# Patient Record
Sex: Female | Born: 1935 | Race: Black or African American | Hispanic: No | Marital: Married
Health system: Southern US, Community
[De-identification: ages and names within clinical notes are randomized; demographics above are authoritative.]

---

## 2003-10-01 ENCOUNTER — Other Ambulatory Visit: Payer: Self-pay

## 2003-10-02 ENCOUNTER — Other Ambulatory Visit: Payer: Self-pay

## 2004-03-29 ENCOUNTER — Inpatient Hospital Stay: Payer: Self-pay | Admitting: Internal Medicine

## 2004-03-29 ENCOUNTER — Other Ambulatory Visit: Payer: Self-pay

## 2004-08-21 ENCOUNTER — Ambulatory Visit: Payer: Self-pay | Admitting: Cardiology

## 2005-05-08 ENCOUNTER — Ambulatory Visit: Payer: Self-pay | Admitting: Ophthalmology

## 2005-05-28 ENCOUNTER — Ambulatory Visit: Payer: Self-pay | Admitting: Ophthalmology

## 2005-07-24 ENCOUNTER — Ambulatory Visit: Payer: Self-pay | Admitting: Internal Medicine

## 2005-08-05 ENCOUNTER — Ambulatory Visit: Payer: Self-pay | Admitting: Internal Medicine

## 2005-10-06 ENCOUNTER — Ambulatory Visit: Payer: Self-pay | Admitting: Internal Medicine

## 2006-01-31 ENCOUNTER — Other Ambulatory Visit: Payer: Self-pay

## 2006-01-31 ENCOUNTER — Inpatient Hospital Stay: Payer: Self-pay | Admitting: Internal Medicine

## 2006-02-01 ENCOUNTER — Other Ambulatory Visit: Payer: Self-pay

## 2006-02-02 ENCOUNTER — Other Ambulatory Visit: Payer: Self-pay

## 2006-08-19 ENCOUNTER — Ambulatory Visit: Payer: Self-pay | Admitting: Internal Medicine

## 2007-01-03 ENCOUNTER — Other Ambulatory Visit: Payer: Self-pay

## 2007-01-03 ENCOUNTER — Inpatient Hospital Stay: Payer: Self-pay | Admitting: Internal Medicine

## 2007-03-21 ENCOUNTER — Other Ambulatory Visit: Payer: Self-pay

## 2007-03-21 ENCOUNTER — Inpatient Hospital Stay: Payer: Self-pay | Admitting: Internal Medicine

## 2007-05-02 ENCOUNTER — Inpatient Hospital Stay: Payer: Self-pay | Admitting: Internal Medicine

## 2007-05-02 ENCOUNTER — Other Ambulatory Visit: Payer: Self-pay

## 2007-07-27 ENCOUNTER — Ambulatory Visit: Payer: Self-pay | Admitting: Internal Medicine

## 2007-08-05 ENCOUNTER — Ambulatory Visit: Payer: Self-pay | Admitting: General Surgery

## 2007-08-09 ENCOUNTER — Ambulatory Visit: Payer: Self-pay | Admitting: General Surgery

## 2007-08-19 ENCOUNTER — Ambulatory Visit: Payer: Self-pay | Admitting: Internal Medicine

## 2007-10-14 ENCOUNTER — Ambulatory Visit: Payer: Self-pay | Admitting: Internal Medicine

## 2007-10-15 ENCOUNTER — Emergency Department: Payer: Self-pay | Admitting: Internal Medicine

## 2007-10-15 ENCOUNTER — Other Ambulatory Visit: Payer: Self-pay

## 2008-11-30 ENCOUNTER — Ambulatory Visit: Payer: Self-pay | Admitting: Internal Medicine

## 2009-11-04 ENCOUNTER — Emergency Department: Payer: Self-pay | Admitting: Emergency Medicine

## 2009-12-15 IMAGING — US US CAROTID DUPLEX BILAT
1 series · 17 of 24 positions shown · non-contrast
Comparison: none

REASON FOR EXAM: syncope
COMMENTS:

[Series 1: us carotid duplex bilat · 17 of 56 slices shown]
[im 1/56]
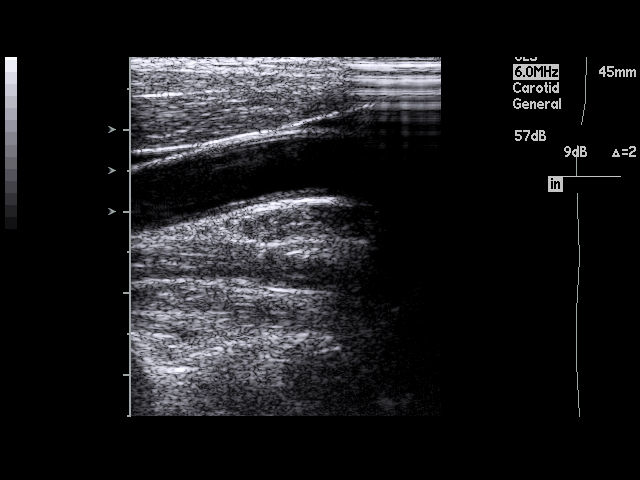
[im 5/56]
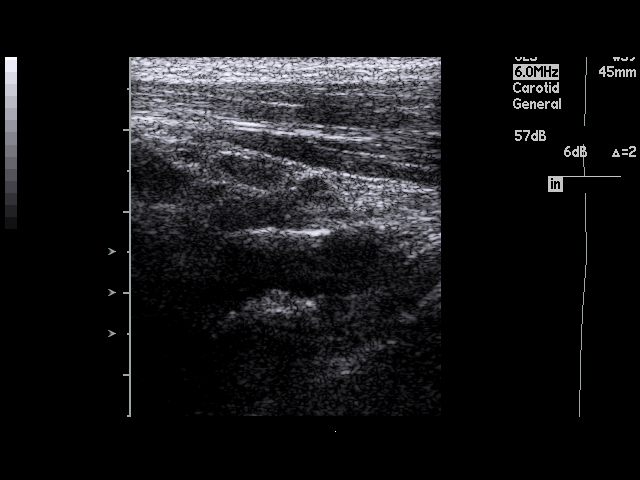
[im 8/56]
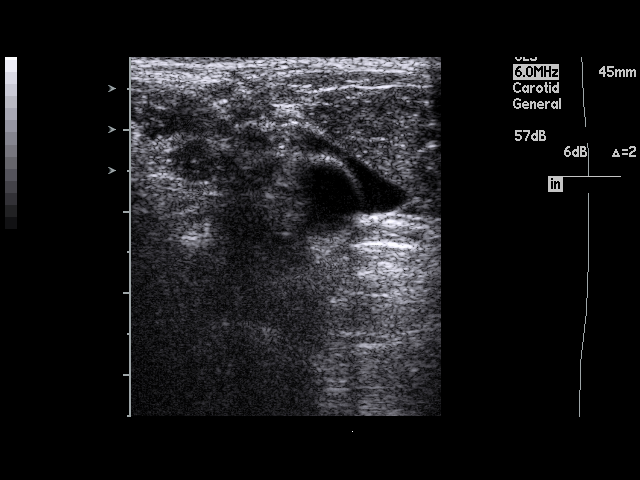
[im 10/56]
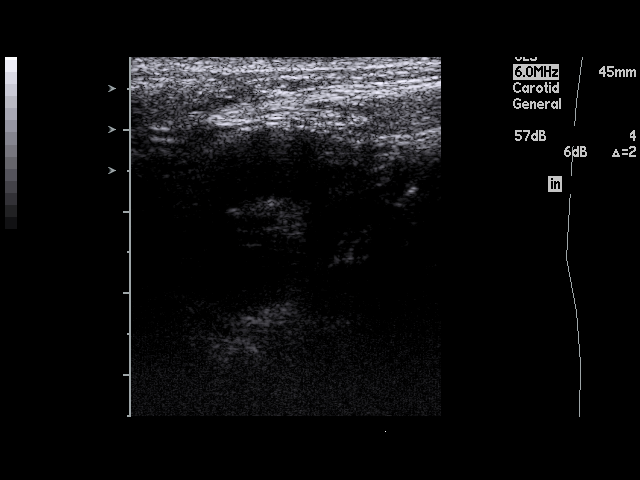
[im 15/56]
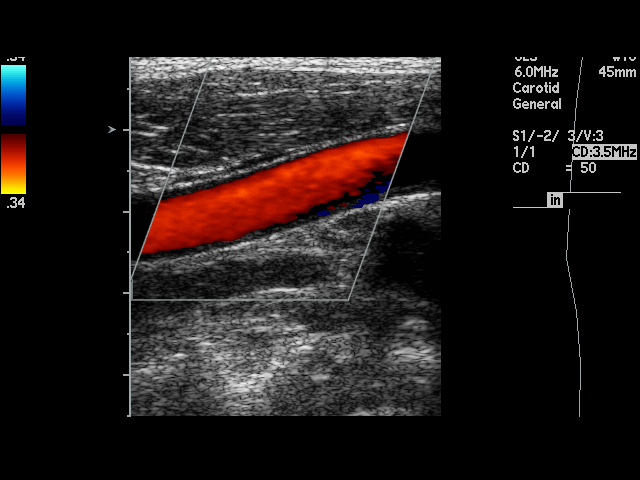
[im 17/56]
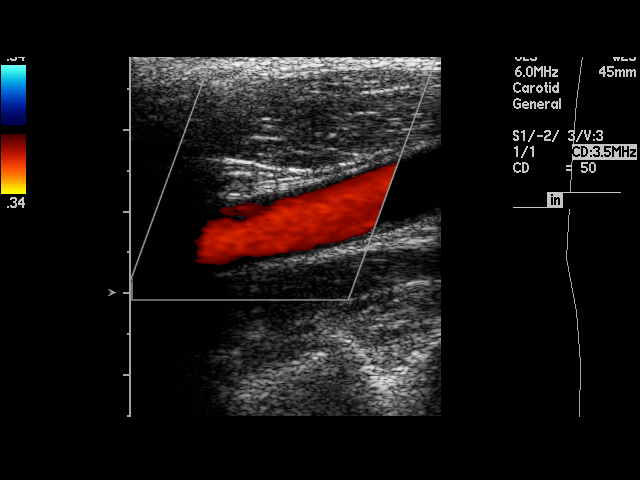
[im 22/56]
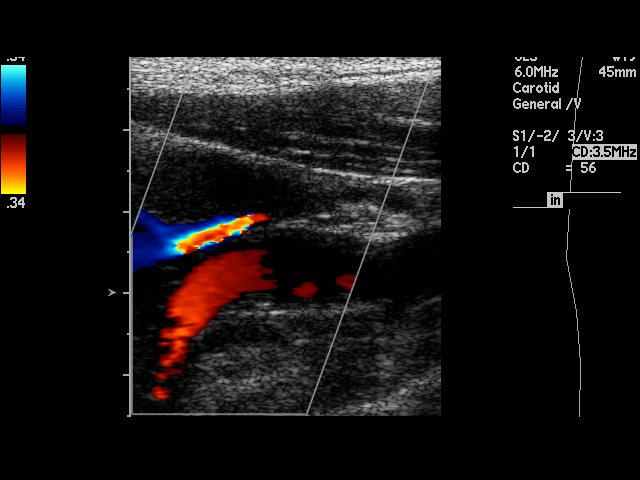
[im 24/56]
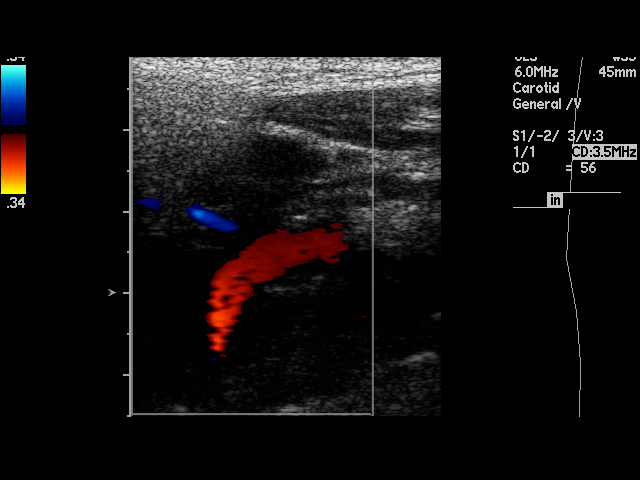
[im 29/56]
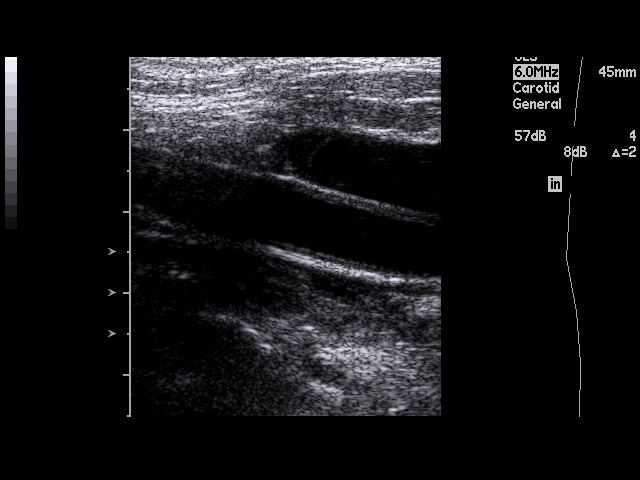
[im 32/56]
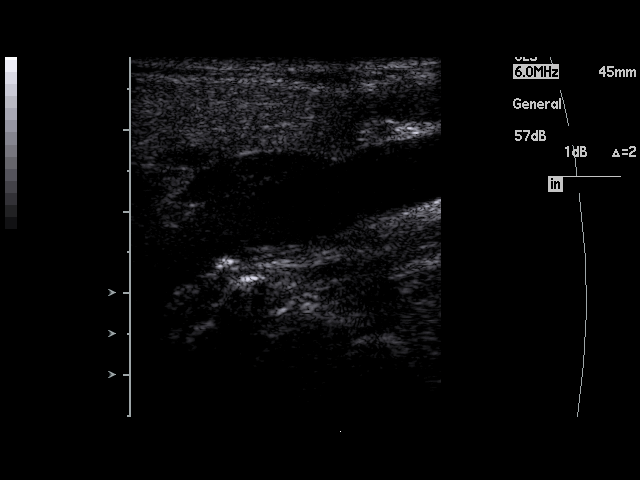
[im 34/56]
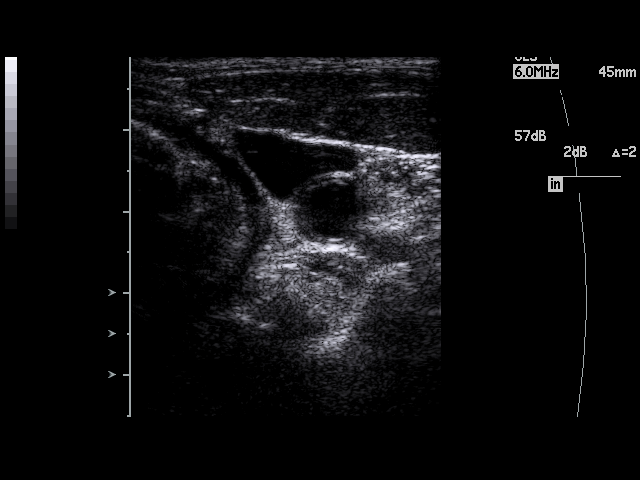
[im 39/56]
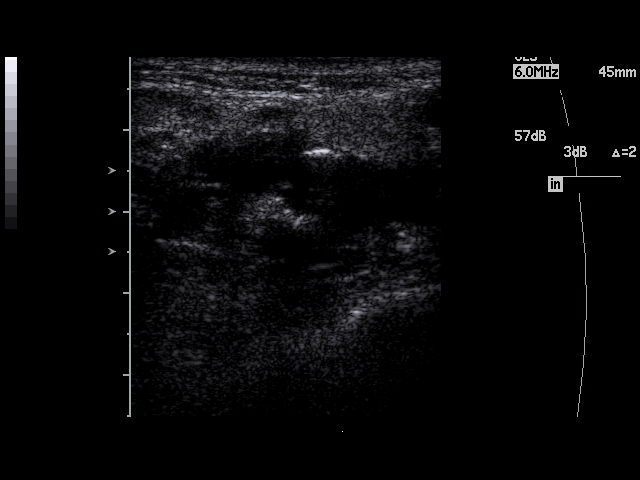
[im 41/56]
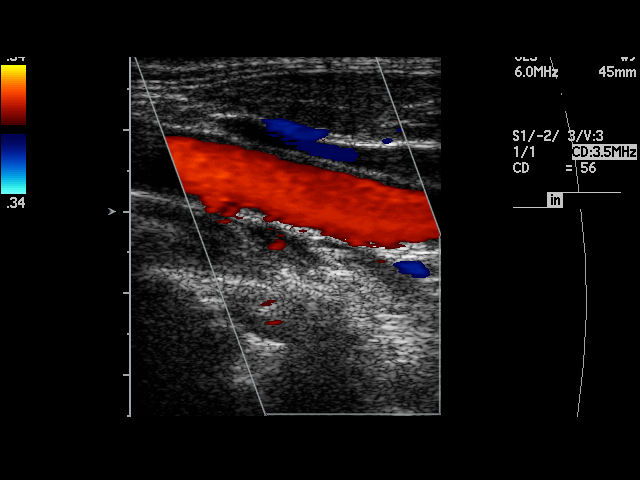
[im 46/56]
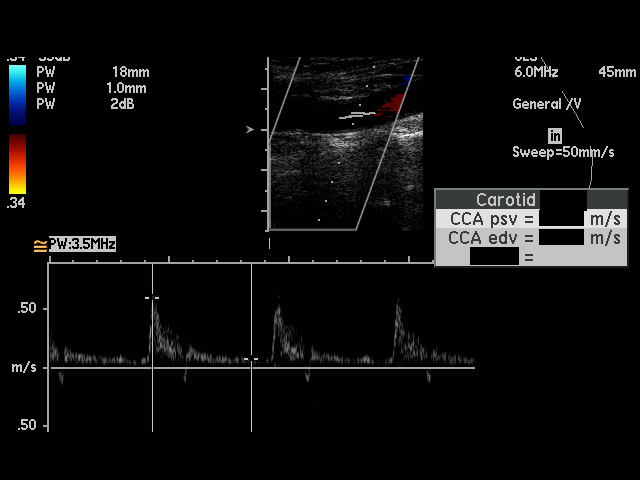
[im 48/56]
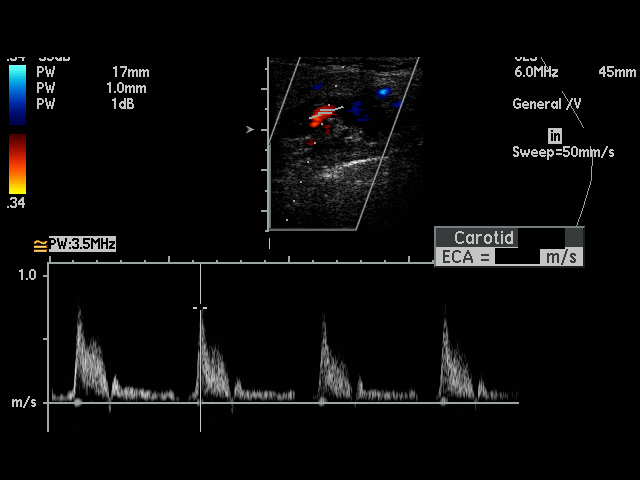
[im 51/56]
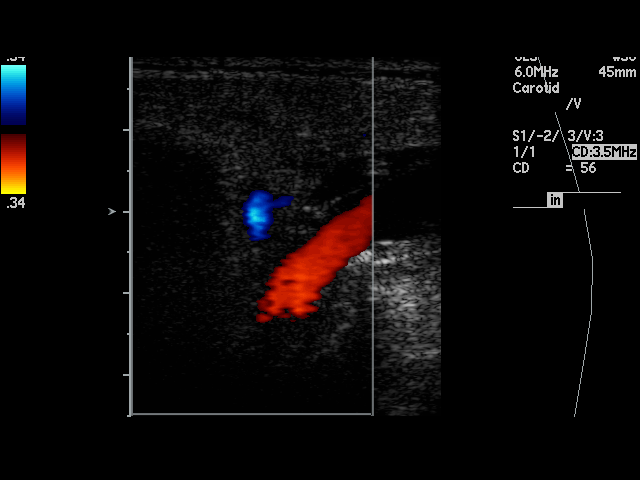
[im 56/56]
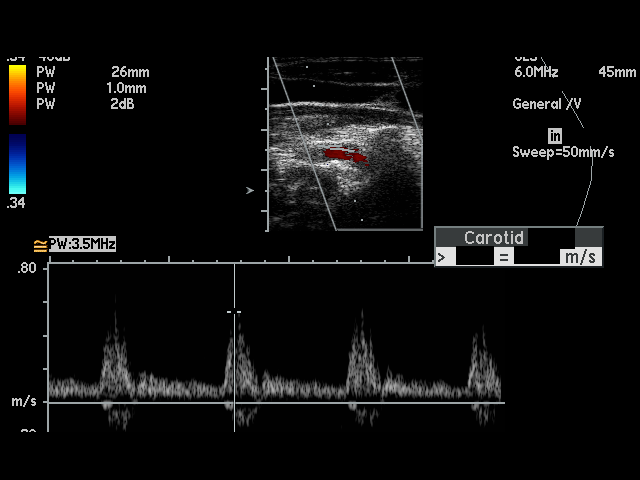

[17 of 24 positions shown; findings below may reference images not displayed]

PROCEDURE:     US  - US CAROTID DOPPLER BILATERAL  - March 22, 2007 [DATE]

RESULT:     There is slight calcific plaque formation about the carotid
bifurcations bilaterally.  On the RIGHT, the peak RIGHT common carotid
artery flow velocity measures 0.568 meters/second and the peak RIGHT
internal carotid artery flow velocity measures 0.535 meters/second. IC/CC
ratio is 0.942.

On the LEFT, the peak LEFT common carotid artery flow velocity measures
0.598 meters/second and the peak LEFT internal carotid artery flow velocity
measures 0.491 meters/second.  IC/CC ratio is 0.821.  These values
bilaterally are in the normal range and are consistent with the absence of
hemodynamically significant stenosis.

There is observed antegrade flow in both vertebrals.
IMPRESSION: 1.     No hemodynamically significant stenosis is identified on either side.
2.     Slight calcific plaque formation is noted at the carotid bifurcations
bilaterally.
3.     There is antegrade flow in both vertebrals.

## 2010-01-30 ENCOUNTER — Ambulatory Visit: Payer: Self-pay | Admitting: Unknown Physician Specialty

## 2010-01-31 ENCOUNTER — Inpatient Hospital Stay: Payer: Self-pay

## 2010-02-03 LAB — CANCER ANTIGEN 19-9: CA 19-9: 22 U/mL (ref 0–35)

## 2010-05-23 ENCOUNTER — Ambulatory Visit: Payer: Self-pay | Admitting: Internal Medicine

## 2010-10-19 ENCOUNTER — Emergency Department: Payer: Self-pay | Admitting: Emergency Medicine

## 2011-05-11 ENCOUNTER — Emergency Department: Payer: Self-pay | Admitting: Emergency Medicine

## 2011-05-11 LAB — CBC
HGB: 14.7 g/dL (ref 12.0–16.0)
MCH: 31 pg (ref 26.0–34.0)
MCHC: 33.5 g/dL (ref 32.0–36.0)
Platelet: 221 10*3/uL (ref 150–440)
RBC: 4.75 10*6/uL (ref 3.80–5.20)
RDW: 15.1 % — ABNORMAL HIGH (ref 11.5–14.5)
WBC: 5.9 10*3/uL (ref 3.6–11.0)

## 2011-05-11 LAB — COMPREHENSIVE METABOLIC PANEL
Albumin: 3.9 g/dL (ref 3.4–5.0)
Anion Gap: 8 (ref 7–16)
BUN: 20 mg/dL — ABNORMAL HIGH (ref 7–18)
Co2: 22 mmol/L (ref 21–32)
Creatinine: 1.42 mg/dL — ABNORMAL HIGH (ref 0.60–1.30)
EGFR (African American): 46 — ABNORMAL LOW
Potassium: 4.5 mmol/L (ref 3.5–5.1)
Sodium: 134 mmol/L — ABNORMAL LOW (ref 136–145)

## 2011-05-11 LAB — URINALYSIS, COMPLETE
Bilirubin,UR: NEGATIVE
Glucose,UR: NEGATIVE mg/dL (ref 0–75)
Ketone: NEGATIVE
Leukocyte Esterase: NEGATIVE
Protein: 75
Specific Gravity: 1.03 (ref 1.003–1.030)
Squamous Epithelial: NONE SEEN

## 2012-05-22 ENCOUNTER — Observation Stay: Payer: Self-pay | Admitting: Family Medicine

## 2012-05-22 LAB — COMPREHENSIVE METABOLIC PANEL
Albumin: 3.5 g/dL (ref 3.4–5.0)
Anion Gap: 7 (ref 7–16)
Bilirubin,Total: 0.2 mg/dL (ref 0.2–1.0)
Calcium, Total: 8 mg/dL — ABNORMAL LOW (ref 8.5–10.1)
Chloride: 101 mmol/L (ref 98–107)
Co2: 24 mmol/L (ref 21–32)
Creatinine: 1.62 mg/dL — ABNORMAL HIGH (ref 0.60–1.30)
EGFR (African American): 34 — ABNORMAL LOW
EGFR (Non-African Amer.): 29 — ABNORMAL LOW
Glucose: 115 mg/dL — ABNORMAL HIGH (ref 65–99)
Osmolality: 269 (ref 275–301)
SGPT (ALT): 150 U/L — ABNORMAL HIGH (ref 12–78)
Total Protein: 7 g/dL (ref 6.4–8.2)

## 2012-05-22 LAB — URINALYSIS, COMPLETE
Glucose,UR: NEGATIVE mg/dL (ref 0–75)
Nitrite: NEGATIVE
Ph: 5 (ref 4.5–8.0)
Specific Gravity: 1.017 (ref 1.003–1.030)
Squamous Epithelial: 2
WBC UR: 3 /HPF (ref 0–5)

## 2012-05-22 LAB — CBC
HCT: 42 % (ref 35.0–47.0)
MCV: 90 fL (ref 80–100)
RBC: 4.66 10*6/uL (ref 3.80–5.20)
RDW: 15.1 % — ABNORMAL HIGH (ref 11.5–14.5)

## 2012-05-22 LAB — CK TOTAL AND CKMB (NOT AT ARMC)
CK-MB: 12.8 ng/mL — ABNORMAL HIGH (ref 0.5–3.6)
CK-MB: 12.6 ng/mL — ABNORMAL HIGH (ref 0.5–3.6)

## 2012-05-22 LAB — TSH: Thyroid Stimulating Horm: 3.09 u[IU]/mL

## 2012-05-22 LAB — TROPONIN I: Troponin-I: 0.25 ng/mL — ABNORMAL HIGH

## 2012-05-23 LAB — CBC WITH DIFFERENTIAL/PLATELET
Basophil %: 1 %
Eosinophil #: 0 10*3/uL (ref 0.0–0.7)
Eosinophil %: 0.2 %
Lymphocyte #: 0.4 10*3/uL — ABNORMAL LOW (ref 1.0–3.6)
Lymphocyte %: 22.9 %
MCH: 29.8 pg (ref 26.0–34.0)
MCV: 90 fL (ref 80–100)
Monocyte #: 0.3 x10 3/mm (ref 0.2–0.9)
Monocyte %: 16.4 %
Neutrophil #: 1.1 10*3/uL — ABNORMAL LOW (ref 1.4–6.5)
Neutrophil %: 59.5 %
RBC: 4.66 10*6/uL (ref 3.80–5.20)
WBC: 1.8 10*3/uL — CL (ref 3.6–11.0)

## 2012-05-23 LAB — BASIC METABOLIC PANEL
BUN: 18 mg/dL (ref 7–18)
Calcium, Total: 7.9 mg/dL — ABNORMAL LOW (ref 8.5–10.1)
Co2: 24 mmol/L (ref 21–32)
Creatinine: 1.22 mg/dL (ref 0.60–1.30)
EGFR (Non-African Amer.): 41 — ABNORMAL LOW
Osmolality: 272 (ref 275–301)

## 2012-05-23 LAB — TROPONIN I: Troponin-I: 0.19 ng/mL — ABNORMAL HIGH

## 2012-05-23 LAB — HEPATIC FUNCTION PANEL A (ARMC)
Albumin: 3.1 g/dL — ABNORMAL LOW (ref 3.4–5.0)
Bilirubin, Direct: <0.1 mg/dL (ref 0.00–0.20)
Bilirubin,Total: 0.2 mg/dL (ref 0.2–1.0)
SGOT(AST): 141 U/L — ABNORMAL HIGH (ref 15–37)
Total Protein: 6.5 g/dL (ref 6.4–8.2)

## 2012-05-23 LAB — CK TOTAL AND CKMB (NOT AT ARMC)
CK, Total: 475 U/L — ABNORMAL HIGH (ref 21–215)
CK-MB: 17.4 ng/mL — ABNORMAL HIGH (ref 0.5–3.6)

## 2012-05-23 LAB — URINE CULTURE

## 2012-05-24 LAB — CBC WITH DIFFERENTIAL/PLATELET
Basophil #: 0 10*3/uL (ref 0.0–0.1)
Basophil %: 0.3 %
Eosinophil #: 0 10*3/uL (ref 0.0–0.7)
Eosinophil %: 0 %
HCT: 42.3 % (ref 35.0–47.0)
HGB: 14.1 g/dL (ref 12.0–16.0)
Lymphocyte #: 0.4 10*3/uL — ABNORMAL LOW (ref 1.0–3.6)
Lymphocyte %: 14.8 %
MCH: 30 pg (ref 26.0–34.0)
MCV: 90 fL (ref 80–100)
Monocyte #: 0.4 x10 3/mm (ref 0.2–0.9)
Neutrophil %: 70.9 %
Platelet: 152 10*3/uL (ref 150–440)
RBC: 4.69 10*6/uL (ref 3.80–5.20)
RDW: 15.3 % — ABNORMAL HIGH (ref 11.5–14.5)
WBC: 2.7 10*3/uL — ABNORMAL LOW (ref 3.6–11.0)

## 2012-05-24 LAB — COMPREHENSIVE METABOLIC PANEL
BUN: 13 mg/dL (ref 7–18)
Calcium, Total: 8 mg/dL — ABNORMAL LOW (ref 8.5–10.1)
Co2: 22 mmol/L (ref 21–32)
EGFR (African American): 56 — ABNORMAL LOW
Glucose: 99 mg/dL (ref 65–99)
Sodium: 135 mmol/L — ABNORMAL LOW (ref 136–145)
Total Protein: 6.3 g/dL — ABNORMAL LOW (ref 6.4–8.2)

## 2012-05-25 LAB — CBC WITH DIFFERENTIAL/PLATELET
Eosinophil %: 0 %
MCH: 29.7 pg (ref 26.0–34.0)
MCHC: 33.5 g/dL (ref 32.0–36.0)
MCV: 89 fL (ref 80–100)
Monocyte %: 13.9 %
Neutrophil #: 2.3 10*3/uL (ref 1.4–6.5)
Neutrophil %: 70.6 %
Platelet: 149 10*3/uL — ABNORMAL LOW (ref 150–440)
RBC: 4.62 10*6/uL (ref 3.80–5.20)

## 2012-05-25 LAB — COMPREHENSIVE METABOLIC PANEL
Albumin: 3.1 g/dL — ABNORMAL LOW (ref 3.4–5.0)
Anion Gap: 8 (ref 7–16)
BUN: 14 mg/dL (ref 7–18)
Chloride: 104 mmol/L (ref 98–107)
Co2: 23 mmol/L (ref 21–32)
Glucose: 99 mg/dL (ref 65–99)
Osmolality: 271 (ref 275–301)
SGOT(AST): 95 U/L — ABNORMAL HIGH (ref 15–37)
SGPT (ALT): 118 U/L — ABNORMAL HIGH (ref 12–78)
Total Protein: 6.4 g/dL (ref 6.4–8.2)

## 2012-06-10 ENCOUNTER — Ambulatory Visit: Payer: Self-pay | Admitting: Internal Medicine

## 2012-06-20 ENCOUNTER — Inpatient Hospital Stay: Payer: Self-pay | Admitting: Family Medicine

## 2012-06-20 LAB — CBC WITH DIFFERENTIAL/PLATELET
Basophil #: 0 10*3/uL (ref 0.0–0.1)
Basophil %: 0.1 %
Eosinophil #: 0 10*3/uL (ref 0.0–0.7)
Lymphocyte #: 0.3 10*3/uL — ABNORMAL LOW (ref 1.0–3.6)
MCH: 29.8 pg (ref 26.0–34.0)
MCHC: 34.1 g/dL (ref 32.0–36.0)
MCV: 88 fL (ref 80–100)
Monocyte #: 1.1 x10 3/mm — ABNORMAL HIGH (ref 0.2–0.9)
Monocyte %: 6.2 %
Neutrophil #: 15.9 10*3/uL — ABNORMAL HIGH (ref 1.4–6.5)
Neutrophil %: 92.2 %
RBC: 4.15 10*6/uL (ref 3.80–5.20)

## 2012-06-20 LAB — COMPREHENSIVE METABOLIC PANEL
Albumin: 2.7 g/dL — ABNORMAL LOW (ref 3.4–5.0)
Bilirubin,Total: 0.6 mg/dL (ref 0.2–1.0)
Calcium, Total: 8.9 mg/dL (ref 8.5–10.1)
Chloride: 101 mmol/L (ref 98–107)
Co2: 23 mmol/L (ref 21–32)
Creatinine: 1.22 mg/dL (ref 0.60–1.30)
EGFR (Non-African Amer.): 41 — ABNORMAL LOW
Glucose: 146 mg/dL — ABNORMAL HIGH (ref 65–99)
SGOT(AST): 35 U/L (ref 15–37)
SGPT (ALT): 27 U/L (ref 12–78)
Sodium: 132 mmol/L — ABNORMAL LOW (ref 136–145)
Total Protein: 7.2 g/dL (ref 6.4–8.2)

## 2012-06-20 LAB — TSH: Thyroid Stimulating Horm: 2.19 u[IU]/mL

## 2012-06-20 LAB — PRO B NATRIURETIC PEPTIDE: B-Type Natriuretic Peptide: 26057 pg/mL — ABNORMAL HIGH (ref 0–450)

## 2012-06-20 LAB — TROPONIN I: Troponin-I: 0.08 ng/mL — ABNORMAL HIGH

## 2012-06-21 LAB — CBC WITH DIFFERENTIAL/PLATELET
Basophil %: 0.4 %
Eosinophil #: 0 10*3/uL (ref 0.0–0.7)
Eosinophil %: 0 %
HCT: 36 % (ref 35.0–47.0)
HGB: 12.2 g/dL (ref 12.0–16.0)
MCH: 29.5 pg (ref 26.0–34.0)
MCV: 87 fL (ref 80–100)
Monocyte %: 5.9 %
Neutrophil #: 17 10*3/uL — ABNORMAL HIGH (ref 1.4–6.5)
Neutrophil %: 92.4 %
Platelet: 414 10*3/uL (ref 150–440)
RBC: 4.14 10*6/uL (ref 3.80–5.20)
RDW: 14.1 % (ref 11.5–14.5)
WBC: 18.4 10*3/uL — ABNORMAL HIGH (ref 3.6–11.0)

## 2012-06-21 LAB — BASIC METABOLIC PANEL
BUN: 32 mg/dL — ABNORMAL HIGH (ref 7–18)
Chloride: 101 mmol/L (ref 98–107)
Co2: 22 mmol/L (ref 21–32)
Creatinine: 1.15 mg/dL (ref 0.60–1.30)
EGFR (African American): 51 — ABNORMAL LOW
Glucose: 143 mg/dL — ABNORMAL HIGH (ref 65–99)
Osmolality: 276 (ref 275–301)
Potassium: 4 mmol/L (ref 3.5–5.1)
Sodium: 133 mmol/L — ABNORMAL LOW (ref 136–145)

## 2012-06-21 LAB — LIPID PANEL
Ldl Cholesterol, Calc: 74 mg/dL (ref 0–100)
Triglycerides: 73 mg/dL (ref 0–200)
VLDL Cholesterol, Calc: 15 mg/dL (ref 5–40)

## 2012-06-21 LAB — PRO B NATRIURETIC PEPTIDE: B-Type Natriuretic Peptide: 21717 pg/mL — ABNORMAL HIGH (ref 0–450)

## 2012-06-21 LAB — MAGNESIUM: Magnesium: 2 mg/dL

## 2012-06-21 LAB — TROPONIN I: Troponin-I: 0.07 ng/mL — ABNORMAL HIGH

## 2012-06-22 LAB — CBC WITH DIFFERENTIAL/PLATELET
Eosinophil #: 0 10*3/uL (ref 0.0–0.7)
Eosinophil %: 0 %
HCT: 34.9 % — ABNORMAL LOW (ref 35.0–47.0)
Lymphocyte #: 0.3 10*3/uL — ABNORMAL LOW (ref 1.0–3.6)
Lymphocyte %: 1.6 %
Monocyte #: 0.8 x10 3/mm (ref 0.2–0.9)
Neutrophil #: 15.2 10*3/uL — ABNORMAL HIGH (ref 1.4–6.5)
Neutrophil %: 93.5 %
RBC: 4.01 10*6/uL (ref 3.80–5.20)
RDW: 14.6 % — ABNORMAL HIGH (ref 11.5–14.5)

## 2012-06-22 LAB — COMPREHENSIVE METABOLIC PANEL
Albumin: 2.1 g/dL — ABNORMAL LOW (ref 3.4–5.0)
Alkaline Phosphatase: 97 U/L (ref 50–136)
Anion Gap: 9 (ref 7–16)
BUN: 30 mg/dL — ABNORMAL HIGH (ref 7–18)
Bilirubin,Total: 0.5 mg/dL (ref 0.2–1.0)
Calcium, Total: 8 mg/dL — ABNORMAL LOW (ref 8.5–10.1)
Chloride: 102 mmol/L (ref 98–107)
Co2: 27 mmol/L (ref 21–32)
EGFR (Non-African Amer.): 43 — ABNORMAL LOW
Glucose: 139 mg/dL — ABNORMAL HIGH (ref 65–99)
Potassium: 2.7 mmol/L — ABNORMAL LOW (ref 3.5–5.1)
SGOT(AST): 31 U/L (ref 15–37)
SGPT (ALT): 25 U/L (ref 12–78)

## 2012-06-23 LAB — CBC WITH DIFFERENTIAL/PLATELET
Basophil #: 0 10*3/uL (ref 0.0–0.1)
Basophil %: 0.1 %
Eosinophil #: 0 10*3/uL (ref 0.0–0.7)
Eosinophil %: 0.1 %
HGB: 12.9 g/dL (ref 12.0–16.0)
Lymphocyte #: 0.3 10*3/uL — ABNORMAL LOW (ref 1.0–3.6)
Lymphocyte %: 1.9 %
MCH: 29.1 pg (ref 26.0–34.0)
MCV: 88 fL (ref 80–100)
Monocyte #: 0.8 x10 3/mm (ref 0.2–0.9)
Neutrophil #: 15.2 10*3/uL — ABNORMAL HIGH (ref 1.4–6.5)
Neutrophil %: 93.1 %
RBC: 4.43 10*6/uL (ref 3.80–5.20)
RDW: 14.5 % (ref 11.5–14.5)

## 2012-06-23 LAB — BASIC METABOLIC PANEL
Calcium, Total: 8.6 mg/dL (ref 8.5–10.1)
Chloride: 103 mmol/L (ref 98–107)
Co2: 29 mmol/L (ref 21–32)
Creatinine: 1.17 mg/dL (ref 0.60–1.30)
EGFR (African American): 50 — ABNORMAL LOW
EGFR (Non-African Amer.): 43 — ABNORMAL LOW
Glucose: 129 mg/dL — ABNORMAL HIGH (ref 65–99)
Potassium: 3.4 mmol/L — ABNORMAL LOW (ref 3.5–5.1)
Sodium: 140 mmol/L (ref 136–145)

## 2012-06-24 LAB — CBC WITH DIFFERENTIAL/PLATELET
Basophil #: 0 10*3/uL (ref 0.0–0.1)
Basophil %: 0.2 %
Eosinophil %: 0.1 %
HCT: 40 % (ref 35.0–47.0)
Lymphocyte #: 0.3 10*3/uL — ABNORMAL LOW (ref 1.0–3.6)
Lymphocyte %: 1.4 %
MCH: 29.3 pg (ref 26.0–34.0)
MCV: 88 fL (ref 80–100)
Monocyte %: 4.1 %
Neutrophil #: 17.8 10*3/uL — ABNORMAL HIGH (ref 1.4–6.5)
Platelet: 506 10*3/uL — ABNORMAL HIGH (ref 150–440)
RBC: 4.54 10*6/uL (ref 3.80–5.20)
RDW: 14.5 % (ref 11.5–14.5)

## 2012-06-24 LAB — BASIC METABOLIC PANEL
BUN: 37 mg/dL — ABNORMAL HIGH (ref 7–18)
Calcium, Total: 8.7 mg/dL (ref 8.5–10.1)
Chloride: 103 mmol/L (ref 98–107)
Co2: 30 mmol/L (ref 21–32)
Creatinine: 1.22 mg/dL (ref 0.60–1.30)
Glucose: 171 mg/dL — ABNORMAL HIGH (ref 65–99)
Potassium: 3.5 mmol/L (ref 3.5–5.1)
Sodium: 140 mmol/L (ref 136–145)

## 2012-06-25 LAB — BASIC METABOLIC PANEL
BUN: 45 mg/dL — ABNORMAL HIGH (ref 7–18)
Calcium, Total: 8.4 mg/dL — ABNORMAL LOW (ref 8.5–10.1)
Co2: 31 mmol/L (ref 21–32)
EGFR (African American): 46 — ABNORMAL LOW
Glucose: 139 mg/dL — ABNORMAL HIGH (ref 65–99)
Potassium: 3.7 mmol/L (ref 3.5–5.1)
Sodium: 143 mmol/L (ref 136–145)

## 2012-06-25 LAB — CBC WITH DIFFERENTIAL/PLATELET
Basophil %: 0.5 %
Eosinophil %: 0.8 %
HCT: 39.4 % (ref 35.0–47.0)
Lymphocyte #: 0.5 10*3/uL — ABNORMAL LOW (ref 1.0–3.6)
Lymphocyte %: 2.7 %
MCH: 29 pg (ref 26.0–34.0)
MCHC: 32.9 g/dL (ref 32.0–36.0)
MCV: 88 fL (ref 80–100)
Monocyte #: 0.7 x10 3/mm (ref 0.2–0.9)
Monocyte %: 4.2 %
Neutrophil %: 91.8 %
RDW: 14.4 % (ref 11.5–14.5)

## 2012-06-26 LAB — CBC WITH DIFFERENTIAL/PLATELET
Basophil #: 0 10*3/uL (ref 0.0–0.1)
Eosinophil #: 0.3 10*3/uL (ref 0.0–0.7)
Eosinophil %: 2.2 %
Lymphocyte %: 3 %
MCHC: 32.8 g/dL (ref 32.0–36.0)
MCV: 89 fL (ref 80–100)
Neutrophil %: 90.3 %
RBC: 4.22 10*6/uL (ref 3.80–5.20)
RDW: 14.8 % — ABNORMAL HIGH (ref 11.5–14.5)

## 2012-06-26 LAB — BASIC METABOLIC PANEL
Anion Gap: 5 — ABNORMAL LOW (ref 7–16)
Calcium, Total: 8.8 mg/dL (ref 8.5–10.1)
Creatinine: 1.18 mg/dL (ref 0.60–1.30)
EGFR (African American): 49 — ABNORMAL LOW
EGFR (Non-African Amer.): 43 — ABNORMAL LOW
Potassium: 3.9 mmol/L (ref 3.5–5.1)

## 2012-06-26 LAB — CULTURE, BLOOD (SINGLE)

## 2012-06-28 LAB — BASIC METABOLIC PANEL
Calcium, Total: 8.5 mg/dL (ref 8.5–10.1)
Chloride: 105 mmol/L (ref 98–107)
Creatinine: 1.03 mg/dL (ref 0.60–1.30)
EGFR (African American): 58 — ABNORMAL LOW
EGFR (Non-African Amer.): 50 — ABNORMAL LOW
Glucose: 107 mg/dL — ABNORMAL HIGH (ref 65–99)
Osmolality: 286 (ref 275–301)
Sodium: 140 mmol/L (ref 136–145)

## 2012-06-28 LAB — CBC WITH DIFFERENTIAL/PLATELET
Basophil %: 0.4 %
Eosinophil #: 0.4 10*3/uL (ref 0.0–0.7)
Eosinophil %: 3.4 %
HCT: 37 % (ref 35.0–47.0)
HGB: 12.1 g/dL (ref 12.0–16.0)
Lymphocyte #: 0.5 10*3/uL — ABNORMAL LOW (ref 1.0–3.6)
MCV: 88 fL (ref 80–100)
Monocyte %: 6 %
Neutrophil %: 85.5 %
RBC: 4.23 10*6/uL (ref 3.80–5.20)

## 2012-06-29 LAB — BASIC METABOLIC PANEL
Anion Gap: 7 (ref 7–16)
BUN: 26 mg/dL — ABNORMAL HIGH (ref 7–18)
Calcium, Total: 8.7 mg/dL (ref 8.5–10.1)
Chloride: 105 mmol/L (ref 98–107)
Co2: 28 mmol/L (ref 21–32)
Glucose: 102 mg/dL — ABNORMAL HIGH (ref 65–99)
Potassium: 4.1 mmol/L (ref 3.5–5.1)

## 2012-06-29 LAB — CBC WITH DIFFERENTIAL/PLATELET
Basophil #: 0.1 10*3/uL (ref 0.0–0.1)
Basophil %: 0.6 %
Eosinophil %: 2.7 %
HCT: 39.7 % (ref 35.0–47.0)
HGB: 12.8 g/dL (ref 12.0–16.0)
Lymphocyte #: 0.5 10*3/uL — ABNORMAL LOW (ref 1.0–3.6)
Lymphocyte %: 3.9 %
MCH: 28.4 pg (ref 26.0–34.0)
MCHC: 32.3 g/dL (ref 32.0–36.0)
MCV: 88 fL (ref 80–100)
Monocyte #: 0.7 x10 3/mm (ref 0.2–0.9)
Monocyte %: 5.6 %
Neutrophil #: 10.2 10*3/uL — ABNORMAL HIGH (ref 1.4–6.5)
Neutrophil %: 87.2 %
Platelet: 517 10*3/uL — ABNORMAL HIGH (ref 150–440)
RDW: 14.4 % (ref 11.5–14.5)
WBC: 11.7 10*3/uL — ABNORMAL HIGH (ref 3.6–11.0)

## 2012-07-11 ENCOUNTER — Ambulatory Visit: Payer: Self-pay | Admitting: Internal Medicine

## 2012-07-31 ENCOUNTER — Emergency Department: Payer: Self-pay | Admitting: Emergency Medicine

## 2012-07-31 LAB — COMPREHENSIVE METABOLIC PANEL
Alkaline Phosphatase: 129 U/L (ref 50–136)
Anion Gap: 9 (ref 7–16)
Bilirubin,Total: 0.5 mg/dL (ref 0.2–1.0)
Calcium, Total: 8.9 mg/dL (ref 8.5–10.1)
Chloride: 101 mmol/L (ref 98–107)
Co2: 26 mmol/L (ref 21–32)
EGFR (African American): 56 — ABNORMAL LOW
EGFR (Non-African Amer.): 49 — ABNORMAL LOW
Glucose: 148 mg/dL — ABNORMAL HIGH (ref 65–99)
Osmolality: 277 (ref 275–301)
SGPT (ALT): 16 U/L (ref 12–78)
Total Protein: 7.2 g/dL (ref 6.4–8.2)

## 2012-07-31 LAB — CBC
HCT: 39.1 % (ref 35.0–47.0)
HGB: 12.9 g/dL (ref 12.0–16.0)
MCV: 84 fL (ref 80–100)
RBC: 4.64 10*6/uL (ref 3.80–5.20)
WBC: 9 10*3/uL (ref 3.6–11.0)

## 2012-07-31 LAB — TROPONIN I: Troponin-I: 0.02 ng/mL

## 2012-07-31 LAB — PRO B NATRIURETIC PEPTIDE: B-Type Natriuretic Peptide: 10596 pg/mL — ABNORMAL HIGH (ref 0–450)

## 2012-07-31 LAB — LIPASE, BLOOD: Lipase: 81 U/L (ref 73–393)

## 2012-09-10 ENCOUNTER — Ambulatory Visit: Payer: Self-pay | Admitting: Internal Medicine

## 2012-09-23 ENCOUNTER — Inpatient Hospital Stay: Payer: Self-pay

## 2012-09-23 LAB — CK TOTAL AND CKMB (NOT AT ARMC): CK-MB: 10.8 ng/mL — ABNORMAL HIGH (ref 0.5–3.6)

## 2012-09-23 LAB — CBC
HCT: 48.8 % — ABNORMAL HIGH (ref 35.0–47.0)
HGB: 16.2 g/dL — ABNORMAL HIGH (ref 12.0–16.0)
MCH: 26.7 pg (ref 26.0–34.0)
MCHC: 33.3 g/dL (ref 32.0–36.0)
MCV: 80 fL (ref 80–100)
Platelet: 193 10*3/uL (ref 150–440)
WBC: 7.5 10*3/uL (ref 3.6–11.0)

## 2012-09-23 LAB — COMPREHENSIVE METABOLIC PANEL
Albumin: 2.7 g/dL — ABNORMAL LOW (ref 3.4–5.0)
Anion Gap: 8 (ref 7–16)
BUN: 26 mg/dL — ABNORMAL HIGH (ref 7–18)
Bilirubin,Total: 0.5 mg/dL (ref 0.2–1.0)
Co2: 24 mmol/L (ref 21–32)
Creatinine: 1.2 mg/dL (ref 0.60–1.30)
EGFR (African American): 48 — ABNORMAL LOW
Osmolality: 269 (ref 275–301)
SGOT(AST): 20 U/L (ref 15–37)
SGPT (ALT): 21 U/L (ref 12–78)
Sodium: 132 mmol/L — ABNORMAL LOW (ref 136–145)
Total Protein: 6.6 g/dL (ref 6.4–8.2)

## 2012-09-23 LAB — PRO B NATRIURETIC PEPTIDE: B-Type Natriuretic Peptide: 22630 pg/mL — ABNORMAL HIGH (ref 0–450)

## 2012-09-24 LAB — BASIC METABOLIC PANEL
Anion Gap: 4 — ABNORMAL LOW (ref 7–16)
BUN: 24 mg/dL — ABNORMAL HIGH (ref 7–18)
Calcium, Total: 8.4 mg/dL — ABNORMAL LOW (ref 8.5–10.1)
Chloride: 100 mmol/L (ref 98–107)
Glucose: 103 mg/dL — ABNORMAL HIGH (ref 65–99)
Osmolality: 271 (ref 275–301)
Potassium: 3.7 mmol/L (ref 3.5–5.1)
Sodium: 133 mmol/L — ABNORMAL LOW (ref 136–145)

## 2012-09-24 LAB — CBC WITH DIFFERENTIAL/PLATELET
Basophil %: 0.6 %
Eosinophil #: 0 10*3/uL (ref 0.0–0.7)
Eosinophil %: 0.4 %
HGB: 12.7 g/dL (ref 12.0–16.0)
Lymphocyte #: 0.3 10*3/uL — ABNORMAL LOW (ref 1.0–3.6)
Lymphocyte %: 5.1 %
MCH: 26.7 pg (ref 26.0–34.0)
Monocyte #: 0.5 x10 3/mm (ref 0.2–0.9)
Monocyte %: 7 %
Neutrophil #: 5.8 10*3/uL (ref 1.4–6.5)
RBC: 4.77 10*6/uL (ref 3.80–5.20)
RDW: 18.2 % — ABNORMAL HIGH (ref 11.5–14.5)
WBC: 6.6 10*3/uL (ref 3.6–11.0)

## 2012-09-24 LAB — CK TOTAL AND CKMB (NOT AT ARMC)
CK, Total: 47 U/L (ref 21–215)
CK, Total: 68 U/L (ref 21–215)
CK-MB: 8.5 ng/mL — ABNORMAL HIGH (ref 0.5–3.6)

## 2012-09-24 LAB — TROPONIN I
Troponin-I: 0.03 ng/mL
Troponin-I: 0.02 ng/mL

## 2012-09-25 LAB — BASIC METABOLIC PANEL
Co2: 25 mmol/L (ref 21–32)
Creatinine: 1.16 mg/dL (ref 0.60–1.30)
Glucose: 99 mg/dL (ref 65–99)
Sodium: 132 mmol/L — ABNORMAL LOW (ref 136–145)

## 2012-09-26 LAB — BASIC METABOLIC PANEL
Anion Gap: 7 (ref 7–16)
BUN: 31 mg/dL — ABNORMAL HIGH (ref 7–18)
Creatinine: 1.3 mg/dL (ref 0.60–1.30)
EGFR (African American): 44 — ABNORMAL LOW
EGFR (Non-African Amer.): 38 — ABNORMAL LOW
Glucose: 75 mg/dL (ref 65–99)
Osmolality: 279 (ref 275–301)
Potassium: 4.5 mmol/L (ref 3.5–5.1)
Sodium: 137 mmol/L (ref 136–145)

## 2012-09-27 LAB — BASIC METABOLIC PANEL
Anion Gap: 5 — ABNORMAL LOW (ref 7–16)
Calcium, Total: 8.9 mg/dL (ref 8.5–10.1)
Chloride: 102 mmol/L (ref 98–107)
Co2: 30 mmol/L (ref 21–32)
EGFR (African American): >60
EGFR (Non-African Amer.): 52 — ABNORMAL LOW
Glucose: 103 mg/dL — ABNORMAL HIGH (ref 65–99)
Potassium: 4.2 mmol/L (ref 3.5–5.1)
Sodium: 137 mmol/L (ref 136–145)

## 2012-10-11 ENCOUNTER — Ambulatory Visit: Payer: Self-pay | Admitting: Internal Medicine

## 2012-12-11 DEATH — deceased

## 2014-06-02 NOTE — Consult Note (Signed)
CC: epigastric pain.  Her CRP is over 200, she has minimal- mild discomfort to deep exam, mild tendernes when palpate tightened abd muscles.  Resp rate elevated  and this could be a source for her abd pain.  Severe CHF with very high BNP.  She is a DNR.  She would not likely do very well with EGD.  Will cover with iv PPI if not already on it.  Try mylanta also.    Electronic Signatures: Scot JunElliott, Glenda Spelman T (MD)  (Signed on 12-May-14 19:20)  Authored  Last Updated: 12-May-14 19:20 by Scot JunElliott, Pati Thinnes T (MD)

## 2014-06-02 NOTE — Discharge Summary (Signed)
PATIENT NAME:  [name] [name], [name] [name] MR#:  [id number] DATE OF BIRTH:  March 18, 1926  DATE OF ADMISSION:  09/23/2012 DATE OF DISCHARGE:  09/27/2012  PRIMARY CARE PHYSICIAN: Marisue IvanKanhka Linthavong, MD.  CONSULTING: Palliative care, Dr. Harriett SineNancy Phifer.  DISCHARGE: To hospice home.   HISTORY OF PRESENT ILLNESS: This is an 81 year old with severe CHF and progressive decline over the last several months. She was admitted 08/14 with CHF exacerbation requiring oxygenation as well as heel decubitus and severe malnutrition. Please see admission history and physical for details.   HOSPITAL COURSE BY ISSUE: CHF exacerbation. The patient was gently diuresed; however, she continued with worsening oxygen status. She, by the day of discharge, was requiring Ventimask. Palliative care followed closely and discussed with the family transfer to hospice home. Family was in agreement. The patient will be discharged to hospice home for comfort care.   CODE STATUS: DNR.   DISCHARGE MEDICATIONS:  1.  Lasix 40 mg 1 tablet twice a day.  2.  Nitroglycerin sublingual as needed.  3.  Zoloft 100 mg once a day.  4.  Lorazepam 0.5 mg sublingually every 2 to 4 hours as needed for agitation and anxiety.  5.  Morphine 20 mg/mL concentrate 0.5 mL orally every 1 to 2 hours as needed.  6.  Zofran 4 mg oral dissolvable tablet 1 every 6 hours as needed.   DISCHARGE CODE STATUS: DNR.   DISCHARGE DIET: As tolerated.  TIME SPENT: This discharge took 25 minutes.  ____________________________ Stann Mainlandavid P. Sampson GoonFitzgerald, MD dpf:aw D: 09/27/2012 21:23:16 ET T: 09/28/2012 08:24:53 ET JOB#: [id number]  cc: Stann Mainlandavid P. Sampson GoonFitzgerald, MD, <Dictator> Marisue IvanKanhka Linthavong, MD DAVID [facility]AL[facility] MD ELECTRONICALLY SIGNED 10/01/2012 19:16

## 2014-06-02 NOTE — Consult Note (Signed)
PATIENT NAME:  [name] [name], [name] [name] MR#:  [id number] DATE OF BIRTH:  1926/04/12  DATE OF CONSULTATION:  06/23/2012  REFERRING PHYSICIAN:   CONSULTING PHYSICIAN:  Cristal Deerhristopher A. Nature Kueker, MD  REASON FOR CONSULTATION: Epigastric pain and shortness of breath.   HISTORY OF PRESENT ILLNESS: Ms. [name] [name] is a pleasant 81 year old female with history of coronary artery disease, CHF from ischemic cardiomyopathy with an EF of 30%, history of pacemaker placement, dyslipidemia and peripheral vascular disease, who comes back on 05/11 with epigastric pain and shortness of breath. She says that she no longer has any epigastric pain. She has had pain like this before and was able to take Mylanta and it did improve. She has never had an EGD. She has also been admitted for respiratory failure and has been treated for that. No fevers, chills, night sweats, shortness of breath, cough, chest pain, nausea, vomiting, diarrhea, constipation, dysuria, or hematuria.   PAST MEDICAL HISTORY: 1.  Ischemic cardiomyopathy and CHF with EF of 30%.  2.  Coronary artery disease, status post MI.  3.  Irritable bowel syndrome.  4.  Hypertension.  5.  Hyperlipidemia.  6.  Status post defib. placement for history of recurrent V. tach.  7.  Peripheral vascular disease.  8.  History of seasonal allergies.  9.  GERD.  10. Osteoarthritis.  11. Depression.  12. History of cholecystectomy and hysterectomy and cataract surgery.  ALLERGIES: LIPITOR, MEVACOR AND STATINS.   FAMILY HISTORY: Positive for CVA in her mother and colon cancer her father.   SOCIAL HISTORY: No tobacco use currently. Does not use alcohol or illegal drugs.   HOME MEDICATIONS:  1.  Tylenol.  2.  Tramadol.  3.  Systane ultra 4.  Sertraline.  5.  Protonix.  6.  Nitroglycerin p.[name].n.  7.  MiraLax.  8.  Gabapentin.  9.  Aspirin.  10. Amiodarone. 11. Allegra.  REVIEW OF SYSTEMS: A 12-point review of systems was obtained. Pertinent positives and negatives as  above.   PHYSICAL EXAMINATION:  VITAL SIGNS: Temperature 97.5, pulse 87, blood pressure 153/95, respirations 18, 92% on room air.  GENERAL: No acute distress. Alert and oriented x 3.  HEAD: Normocephalic, atraumatic. Eyes: No scleral icterus. No conjunctivitis.  LUNGS: Clear to auscultation, moving air well.  HEART: Regular rate and rhythm. No murmurs, rubs or gallops.  ABDOMEN: Soft, mildly tender epigastrium, nondistended.  EXTREMITIES: Moves all extremities well. Strength 5 out of 5.  NEUROLOGIC: Sensation intact in all 4 extremities. Cranial nerves II through XII grossly intact.   LABORATORY AND DIAGNOSTIC DATA: A white cell count of 16.3 with 93% neutrophils. Otherwise labs are relatively unremarkable. White cell count is slightly down from admission of 17.2. CT scan shows bilateral infiltrates. No obvious findings on noncontrast CT.   ASSESSMENT AND PLAN: Ms. [name] [name] is a pleasant 81 year old female with history of recurrent epigastric pain, which is improved with Mylanta. She is currently being seen by gastroenterology, who does not think that she is a candidate for EGD due to her medical comorbidities. Would continue treatment for peptic ulcer disease that she has responded to Mylanta. I did not see anything obvious in her abdomen. Her white cell count is elevated, the etiology of that is unknown. It does not appear to be her abdomen. She may have some component of chronic mesenteric ischemia, which could cause recurrent pain with her heart failure; however, is likely a poor surgical candidate and this may be limited by her cardiac disease. We will continue to follow  with you while in-house.  ____________________________ Si Raider. Om Lizotte, MD cal:aw D: 06/23/2012 07:33:27 ET T: 06/23/2012 07:44:02 ET JOB#: [id number]  cc: Cristal Deer A. Annalise Mcdiarmid, MD, <Dictator> Jarvis Newcomer MD ELECTRONICALLY SIGNED 06/23/2012 15:42

## 2014-06-02 NOTE — Consult Note (Signed)
PATIENT NAME:  [name] [name], [name] [name] MR#:  [id number] DATE OF BIRTH:  [birth year]  DATE OF CONSULTATION:  06/20/2012  REFERRING PHYSICIAN:  Huey Bienenstock, MD CONSULTING PHYSICIAN:  Lamar Blinks, MD  REASON FOR CONSULTATION: Acute on chronic systolic heart failure with coronary artery disease, hypertension, hyperlipidemia, and ischemic cardiomyopathy.   CHIEF COMPLAINT: "I got really short of breath and I have pain. "   HISTORY OF PRESENT ILLNESS:  This is an 81 year old female with known ischemic cardiomyopathy with ejection fraction of 30% by echocardiogram recently, in this past year, with known ventricular tachycardia status post previous defibrillator placement and amiodarone use with coronary artery disease having some shortness of breath, weakness, fatigue, and abdominal and lower chest discomfort. This is occurring more often and is at rest consistent with [place] class IV angina and/or New Heart Association class IV congestive heart failure and consistent with acute on chronic systolic dysfunction heart failure. The patient has received some Lasix to improve her heart failure symptoms, which is slightly improved, especially with oxygen.  Currently her troponin and CK-MB are within normal limits and the EKG has shown normal sinus rhythm with left axis deviation and right bundle branch block. There have been no other telemetry changes since admission.   REVIEW OF SYSTEMS: The remainder are negative for vision change, ringing in the ears, hearing loss, cough, congestion, heartburn, nausea, vomiting, diarrhea, bloody stools, stomach pain, extremity pain, leg weakness, cramping of the buttocks, known blood clots, headaches, blackouts, dizzy spells, nosebleeds, congestion, trouble swallowing, frequent urination, urination at night, muscle weakness, numbness, anxiety, depression, skin lesions, or skin rashes.   PAST MEDICAL HISTORY: 1.  Systolic dysfunction and cardiomyopathy.  2.  Hypertension.   3.  Hyperlipidemia.  4.  Coronary artery disease.  5.  Ventricular tachycardia. 6.  Valvular heart disease.   FAMILY HISTORY: No family members with early onset of cardiovascular disease or hypertension.   SOCIAL HISTORY: Currently denies alcohol or tobacco use.   ALLERGIES: As listed.   MEDICATIONS: As listed.   PHYSICAL EXAMINATION: VITAL SIGNS: Blood pressure 110/68 bilaterally and heart rate 72 upright, reclining, and regular.  GENERAL: She is a well appearing female in no acute distress.  HEENT: No icterus, thyromegaly, ulcers, hemorrhage, or xanthelasma.  HEART:  Regular rate and rhythm. Normal S1 and S2 with an apical murmur consistent with mitral regurgitation. PMI is anterolaterally displaced. Carotid upstroke normal without bruit. Jugular venous pressure is normal.  LUNGS: Bibasilar crackles with decreased breath sounds.  ABDOMEN: Soft and nontender without hepatosplenomegaly or masses. Abdominal aorta is normal size without bruit.  EXTREMITIES: 2+ radial, femoral, and dorsal pedal pulses with 1+ lower extremity edema. No cyanosis, clubbing, or ulcers.  NEUROLOGIC: She is oriented to time, place, and person with normal mood and affect.   ASSESSMENT: An 81 year old female with acute on chronic systolic dysfunction congestive heart failure and possible [place] class IV anginal equivalent without current evidence of myocardial infarction or ventricular tachycardia, on appropriate medication management, improving with Lasix.   RECOMMENDATIONS: 1.  Continue serial ECG and enzymes to assess for possible myocardial infarction.  2.  Ambulate and follow for improvements of symptoms and possible worsening anginal symptoms requiring further intervention including the possibility of cardiac catheterization or additional increase in nitrates.  3.  Further investigation of possible abdominal pain versus anginal equivalent.  4.  Continue telemetry to assess for possible ventricular  tachycardia.  5.  Begin ambulation with adjustments of medications which have been appropriate in the past  as an outpatient with nitrates, beta blocker, and amiodarone for ventricular tachycardia.  ____________________________ Lamar BlinksBruce J. Handsome Anglin, MD bjk:sb D: 06/20/2012 21:37:00 ET T: 06/21/2012 10:22:32 ET JOB#: [id number]  cc: Lamar BlinksBruce J. Io Dieujuste, MD, <Dictator> Lamar BlinksBRUCE J Amma Crear MD ELECTRONICALLY SIGNED 06/21/2012 13:47

## 2014-06-02 NOTE — Discharge Summary (Signed)
PATIENT NAME:  [name] [name], [name] [name] MR#:  [id number] DATE OF BIRTH:  [birth year]  STAT DISCHARGE SUMMARY  DATE OF ADMISSION:  06/20/2012 DATE OF DISCHARGE:  06/29/2012  DISCHARGE DIAGNOSES: 1.  Respiratory failure, secondary to pneumonia and pulmonary edema.  2.  Acute-on-chronic systolic congestive heart failure.  3.  History of coronary artery disease.  4.  Hypertension.  5.  Anxiety and depression.   DISCHARGE MEDICATIONS: 1.  MiraLAX 17 grams p.o. daily as needed for constipation.  2.  Allegra 180 mg p.o. daily.  3. Aspirin 81 mg p.o. daily.  4. Nitroglycerin 0.4 mg sublingual every five minutes x 3 as needed for chest tightness.  5.  Pantoprazole 40 mg p.o. daily.  6.  Sertraline 100 mg p.o. daily.  7.  Gabapentin 300 mg p.o. at bedtime.  8.  Tramadol 50 mg p.o. t.i.d. p.[name].n. for pain.  9.  Probiotic 1 capsule p.o. daily.  10.  Tylenol 500 mg, 2 capsules p.o. t.i.d. as needed for pain.  11.  Furosemide 40 mg p.o. b.i.d.  12.  Potassium chloride 20 mEq p.o. b.i.d.  13.  Ensure Plus 3 times a day with meals.   CONSULTS: None.   PROCEDURES: None.   PERTINENT LABORATORIES AND STUDIES: On the day of discharge: Sodium 140, potassium 4.1, creatinine 1.07, glucose 102, white blood cell count 11.7, hemoglobin 12.8, and platelets 517.   Chest x-ray did show edema and COPD prior to discharge. The patient was on 5 liters of nasal cannula.   BRIEF HOSPITAL COURSE:  1.  Acute respiratory failure: The patient initially came in with acute respiratory failure requiring high-flow O2, noted to have bilateral infiltrates on examination and chest x-ray. The patient was started on Zosyn. Did complete 10 days of Zosyn while she was here. She was given breathing treatments as well as diuretics. She was noted to have pulmonary edema on her chest x-ray with a history of congestive heart failure with a systolic EF of 25% to 30%. The patient has completed her antibiotic therapy and will continue on 5 liters  of nasal cannula to keep O2 sats above 92%.  2.  Systolic congestive heart failure: The patient was noted to have acute-on-chronic heart failure with pulmonary edema, EF of 25% to 30%. The patient's amiodarone was held during her hospital stay. We will continue all her other medications at this time.   Other chronic medical issues remained stable. She was evaluated by palliative care during her hospital stay. Did recommend potential hospice care as an outpatient. Family to discuss this further as an outpatient. [facility] and nursing care.   Will need PT therapy, and also need nasal cannula O2, 5 liters to keep her sats above 92%.    ____________________________ Marisue IvanKanhka Enrique Manganaro, MD kl:dm D: 06/29/2012 12:06:15 ET T: 06/29/2012 12:19:46 ET JOB#: [id number]  cc: Marisue IvanKanhka Kaisen Ackers, MD, <Dictator> Marisue IvanKANHKA Felma Pfefferle MD ELECTRONICALLY SIGNED 07/12/2012 11:17

## 2014-06-02 NOTE — Consult Note (Signed)
CC: Resp failure.  Pt breathing a little better today, abd flat and not tender.  She is doing better from her abd standpoint.  i would change to oral PPI and keep it twice a day.  I will sign off, reconsult if any significant abd change.  Electronic Signatures: Scot JunElliott, Robert T (MD)  (Signed on 16-May-14 18:39)  Authored  Last Updated: 16-May-14 18:39 by Scot JunElliott, Robert T (MD)

## 2014-06-02 NOTE — Consult Note (Signed)
CC: abd pain.  Her abd pain is gone at this time.  Abd flat, not tender, no palpable masses.  Continue Protonix and transition to oral and antacids.    Electronic Signatures: Scot JunElliott, Keatyn Jawad T (MD)  (Signed on 13-May-14 17:00)  Authored  Last Updated: 13-May-14 17:00 by Scot JunElliott, Rion Catala T (MD)

## 2014-06-02 NOTE — Consult Note (Signed)
Breifelly, saw patient for cardiology consultation. She came with episode of syncope, and this time is comfortable sitting in bed eating. In the past had similar episode and was told had dehydration. Found out she is followed by Dr. Gwen PoundsKowalski, thus will have him follow the patient.  Electronic Signatures: Radene KneeKhan, Shaukat Ali (MD)  (Signed on 13-Apr-14 17:49)  Authored  Last Updated: 13-Apr-14 17:49 by Radene KneeKhan, Shaukat Ali (MD)

## 2014-06-02 NOTE — H&P (Signed)
PATIENT NAME:  [name] [name], [name] [name] MR#:  [id number] DATE OF BIRTH:  May 01, 1926  DATE OF ADMISSION:  05/22/2012  PRIMARY CARE PROVIDER: Dr. Burnadette PopLinthavong.   EMERGENCY DEPARTMENT REFERRING PHYSICIAN: Dr. Mindi JunkerGottlieb.   CHIEF COMPLAINT: Syncope x 2.   HISTORY OF PRESENT ILLNESS: The patient is an 81 year old African American female with history of coronary artery disease, history of congestive heart failure due to ischemic cardiomyopathy status post defibrillator placement, history of hypertension, dyslipidemia, peripheral vascular disease, who has been feeling weak over the past few days; however, yesterday, the patient was sitting at the table eating breakfast when she just slumped over on the table. Then, she felt okay. EMS was called. At that time, her blood pressure was low. They told her to hold her blood pressure medication. The patient's daughter this morning held her Imdur but gave her her lisinopril, and then again today, the patient went to the bathroom, and then she was found again slumped down on the table in her bedroom. When she arrived in the ED, her blood pressure was noted to be in the 70s and was given IV fluid bolus here in the ED. The patient reports that she has been eating and drinking okay but has had a cough for the past few days of whitish productive sputum. She also has had some shortness of breath since yesterday. She complained of some epigastric discomfort yesterday. She has not had any nausea, vomiting or diarrhea. Denies any fevers or chills. Denies any chest pains. Denies any frequency, urgency or hesitancy.   ALLERGIES: TO ALL THE STATINS, LIPITOR, MEVACOR, ZOCOR AND PREVACID.   PAST MEDICAL HISTORY: 1.  Coronary artery disease with history of MI x 3.  2.  History of congestive heart failure with ischemic cardiomyopathy with EF of 30% to 35%.  3.  History of angina.  4.  History of irritable bowel syndrome.  5.  Hypertension.  6.  Dyslipidemia.  7.  Peripheral vascular  disease.  8.  History of recurrent V-tach status post cardioversion and defibrillator placement.  9.  Right intraocular vitreous bleeding secondary to anticoagulation with resultant right-sided vision loss.   PAST SURGICAL HISTORY: 1.  Status post cholecystectomy.  2.  Status post defibrillator placement.  3.  History of cardiac cath in 2003, which showed occluded LAD 50%, RCA 60%.  4.  Status post left cataract surgery.   CURRENT MEDICATIONS: She is on:  1.  Amiodarone 200 daily.  2.  Aspirin 81 mg one tab p.o. daily.  3.  Fexofenadine 80 daily.  4.  Isosorbide mononitrate 60 mg daily.  5.  Lisinopril 10 daily.  6.  Nitroglycerin p.[name].n.  7.  Protonix 40 daily.  8.  Tramadol 50 q. 8 p.[name].n.  9.  Gabapentin 300 at bedtime.  10. Sertraline 100 p.o. daily.   SOCIAL HISTORY: Quit smoking more than 60 years ago and has not smoked since. Denies any alcohol or drug use. Lives with her husband and daughter.   FAMILY HISTORY: Mother and sister had CVA. Father had colon cancer. There is a history of hypertension in the family.   REVIEW OF SYSTEMS:  CONSTITUTIONAL: Denies any fevers. Complains of fatigue and weakness. No pain. No weight loss. No weight gain.  EYES: No blurred or double vision. Does have a history of cataract and right eye blindness. No pain. No redness. No inflammation.  ENT: No tinnitus. No ear pain. No hearing loss. No seasonal or year-round allergies. No difficulty swallowing.  RESPIRATORY: Complains of nonproductive  cough. No wheezing. No hemoptysis. No COPD. No TB.  CARDIOVASCULAR: Denies any chest pain. Denies any orthopnea or edema. Has a history of ventricular tachycardia. No palpitations. No syncope.  GASTROINTESTINAL: No nausea, vomiting or diarrhea. No abdominal pain. No hematemesis. No melena.  GENITOURINARY: Denies any dysuria, hematuria, renal calculus or frequency.  ENDOCRINE: Denies any polyuria, nocturia or thyroid problems.  HEMATOLOGIC/LYMPHATIC: Denies any  major bruisability or bleeding.  SKIN: No acne. No rash. No changes in mole, hair or skin.  MUSCULOSKELETAL: Denies any pain in the neck, back or shoulder.  NEUROLOGIC: No CVA. No TIA. No seizures.  PSYCHIATRIC: No anxiety. No insomnia. No ADD.   PHYSICAL EXAMINATION: VITAL SIGNS: Temperature 97.7, pulse 81, respirations 18, blood pressure was 119/45 in the ED.  GENERAL: The patient is an elderly African American female currently not in any acute distress.  HEENT: Head atraumatic, normocephalic. Pupils equal, round and reactive to light and accommodation. There is no conjunctival pallor. No scleral icterus. Nasal exam shows no drainage or ulceration. Oropharynx is clear without any exudate.  NECK: No thyromegaly. No carotid bruits.  CARDIOVASCULAR: Regular rate and rhythm. S1, S2 positive. There is a systolic murmur. No rubs or gallops. Has an implantable cardiac defibrillator on the left side of the chest wall.  ABDOMEN: Soft, nontender and nondistended. Positive bowel sounds x 4. There is no guarding or rebound. No splenomegaly.  EXTREMITIES: No clubbing, cyanosis or edema.  SKIN: No rashes.  LYMPHATICS: No lymph nodes palpable.  NEUROLOGICAL: Cranial nerves II-XII grossly intact. No focal deficits.  PSYCHIATRIC: Awake, alert and oriented x 3. Not anxious or depressed.   EVALUATION: Glucose 115, BUN 23, creatinine 1.62, sodium 132, potassium 3.9, chloride of 101, CO2 is 24. LFTs: AST is 145 and ALT is 150. CPK is 408, CK-MB 12.8, troponin 0.31. TSH 3.09. WBC 2.8, hemoglobin 13.8, platelet count 176. UA: Bacteria 1+. Nitrites and leukocytes negative. Chest x-ray showed no acute abnormality. CT scan of the head showed no acute abnormality.   ASSESSMENT AND PLAN: The patient is an 81 year old with ischemic cardiomyopathy, hypertension, history of ventricular tachycardia, peripheral vascular disease, who presents with syncope x 2 with blood pressure in the 70s in the emergency department.  1.   Syncope due to hypotension. At this time, we will place her on telemetry, place her on observation, give her low-dose fluids in light of her congestive heart failure.  2.  Elevated cardiac enzyme. Follow enzymes. Cardiology evaluation. We will get an echocardiogram. We will place her on aspirin. The patient may have elevated cardiac enzymes due to possible demand ischemia. Has known coronary artery disease, which could also have worsened.  3.  Hypotension. We will hold lisinopril and Imdur. We will repeat orthostatics in the a.m.  4.  History of ventricular tachycardia. Continue amiodarone.  5.  Possible acute bronchitis. We will place her on p.o. Levaquin.  6.  Elevated liver function tests. We will check a hepatitis panel. Follow LFTs in the morning.   TIME SPENT: Thirty-five minutes.   ____________________________ Lacie Scotts Allena Katz, MD shp:lg D: 05/22/2012 16:08:18 ET T: 05/22/2012 16:48:41 ET JOB#: [id number]  cc: Jeffrie Stander H. Allena Katz, MD, <Dictator> Charise Carwin MD ELECTRONICALLY SIGNED 05/25/2012 20:33

## 2014-06-02 NOTE — Consult Note (Signed)
DATE OF BIRTH:  06/06/1926  DATE OF CONSULTATION:  05/23/2012  REFERRING PHYSICIAN:  Dr. Burnadette PopLinthavong  CONSULTING PHYSICIAN:  Dwayne D. Callwood, MD  INDICATION:  Syncope x 2.  HISTORY OF PRESENT ILLNESS:  The patient is an 81 year old African-American female with a history of coronary artery disease, congestive heart failure, ischemic cardiomyopathy, status post AICD, hypertension, dyslipidemia, peripheral vascular disease. States she has been feeling weak over the last few days. The day before admission, she was sitting at a table eating breakfast when she slumped over on the table. Then she felt okay. EMS was called at that time. Her blood pressure was low. They told her to hold her blood pressure medication. The patient's daughter held her Imdur but gave her lisinopril and then again, the patient went to bed  she was found again slumped down on a table in her bedroom. When she arrived in the ED, her blood pressure was noted to be in the 70s, and was given IV fluids. In the ED, the patient reported that she was eating and drinking, but had a cough for the last few days with whitish productive phlegm. She also had some shortness of breath since yesterday. She complained of   discomfort. Denied any nausea, vomiting or diarrhea. No chills or sweat frequency, but she had these 2 episodes of what sounded like hypotension and blackout, so she was brought to the Emergency Room.  REVIEW OF SYSTEMS:  Blackout spells, syncope. No nausea or vomiting. No fever, no chills, no sweats. No weight loss, no weight gain. No hemoptysis, hematemesis. No bright red blood per rectum. No vision change or hearing change. No sputum production or cough.   ALLERGIES:  STATINS, LIPITOR, MEVACOR, ZOCOR, PREVACID.   PAST MEDICAL HISTORY: Coronary artery disease, myocardial infarction x 3, congestive heart failure, ischemic cardiomyopathy, EF 30 to 35, angina, irritable bowel syndrome, hypertension, dyslipidemia, peripheral  vascular disease, recurrent VT,  status post cardioversion and defibrillator placement, right intraocular vitreous bleeding secondary to anticoagulation, resultant right-sided vision loss.  PAST SURGICAL HISTORY:  Includes status post cholecystectomy, status post defibrillator placement, history of cardiac cath in 2003, which showed occluded 50% in the LAD and 60% RCA, status post left cataract surgery.   MEDICATIONS:  Amiodarone 200 a day. Aspirin 81 mg a day. Fexofenadine 80 a day. Imdur 60 a day. Lisinopril 10 a day. Nitroglycerin p.r.n. Protonix 40. Tramadol 50 q. 8 p.r.n. Gabapentin 300 at bedtime. Sertraline 100 mg daily.   FAMILY HISTORY:  Mother and sister had CVA. Father had colon cancer, hypertension.   SOCIAL HISTORY:  Quit smoking 6 years ago, has not smoked since. Lives with her husband and daughter. No alcohol consumption. Retired.   PHYSICAL EXAMINATION: VITAL SIGNS:  Blood pressure was 120/50, pulse of 80, respiratory rate 16, afebrile.  HEENT:  Normocephalic, atraumatic. Pupils equal and reactive to light.  NECK:  Exam was supple. No significant JVD or adenopathy.  LUNGS: Exam was clear to auscultation and percussion. No significant wheeze. No rhonchi or rales.  HEART EXAM: Regular rate and rhythm. Positive S3. Soft S4. PMI displaced laterally.  ABDOMINAL EXAM:  Benign.  EXTREMITY EXAM: Within normal limits.  NEUROLOGIC: Exam intact.  SKIN:  Exam normal.   LABORATORIES:  Glucose 115, BUN 23, creatinine 1.62, sodium 132, potassium 2.9, chloride 101, CO2 of 24. AST 145, ALT 150. CK 408, MB 12.8. Troponin 0.31. TSH 3.09. White count 2.8, hemoglobin 13.8, platelet count 176. UA was unremarkable. CT was unremarkable.   ASSESSMENT: Syncope.  Hypertension. Borderline elevated troponin. Hypotension. Ventricular tachycardia. Cardiomyopathy. Acute bronchitis. LFTs. Chronic renal insufficiency.   PLAN: Agree with admit. Rule out for myocardial infarction. Follow up cardiac enzymes.  Follow up EKG. Follow up telemetry. Place on, again, telemetry to be sure she is not having ventricular arrhythmias. Hold her blood pressure medications for now. Fluid resuscitated because of possible dehydration and orthostatic hypotension. Recommend persistent hypotension treatment and care. Follow up troponins. Again, she has borderline troponins, she has renal insufficiency. This suggests possible demand ischemia. I doubt significant acute myocardial infarction. Possible bronchitis. Continue to follow up LFTs for hepatitis. Renal insufficiency. Again, will hopefully treat the patient conservatively and see how she does. Will consider interrogating the defibrillator, but there is no evidence that it discharged. There is no evidence of chest pain or angina. Hopefully, with resuscitation of fluids, she should do reasonably well on holding her blood pressure medication. Hopefully we can discharge the patient soon.      ____________________________ Bobbie Stack Juliann Pares, MD ddc:mr D: 05/23/2012 19:06:01 ET T: 05/23/2012 20:10:37 ET JOB#: [id number]  cc: Dwayne D. Juliann Pares, MD, <Dictator> Alwyn Pea MD ELECTRONICALLY SIGNED 06/24/2012 10:05

## 2014-06-02 NOTE — Consult Note (Signed)
CC abd pain.  She seems a little stronger today, speaking more clearly.  Abd not distended, not tender, no masses.  She drank 2 bottles of Ensure today.  No new suggestions, continue current GI meds.  Electronic Signatures: Scot JunElliott, Dehaven Sine T (MD)  (Signed on 15-May-14 18:37)  Authored  Last Updated: 15-May-14 18:37 by Scot JunElliott, Srishti Strnad T (MD)

## 2014-06-02 NOTE — Consult Note (Signed)
PATIENT NAME:  [name] [name], [name] [name] DATE OF BIRTH:  Sep 20, 1926  DATE OF CONSULTATION:  06/21/2012  REFERRING PHYSICIAN:   CONSULTING PHYSICIAN:  Scot Junobert T. Cohl Behrens, MD  HISTORY OF PRESENT ILLNESS:  The patient is an 81 year old black female who was admitted and found to have severe congestive heart failure with a BNP over 25,000. She has known heart disease. She has implantable defibrillator pacemaker and a history of decreased ejection fraction. She has systolic congestive heart failure, severe ischemic cardiomyopathy, ejection fraction 25% to 30%, recurrent V. tach, hypertension, peripheral vascular disease. On admission, she had chest x-ray consistent with congestive heart failure. She has developed some epigastric abdominal pain and I was asked to see her.   The patient has mild epigastric and mid upper abdominal discomfort. Her abdomen is soft. There is no peritoneal signs. There is no significant rebound. It is not tender to deep palpation. When she is able to tighten her muscles briefly, it is a little more painful for her.   She has not had any vomiting. She has some dysphagia at times. She and her daughter think her pain may be a little better sometimes with Mylanta and may also be a little better with moving her bowels.    PAST MEDICAL HISTORY: She has had a hysterectomy and her gallbladder out. She is a DNR, nurses may pronounce.   She has had discomfort in her calves, both calf muscles. Ultrasound was negative for clots.   PHYSICAL EXAM:  GENERAL:  Elderly black female with enhanced nasal oxygenation, able to carry on a conversation with short answers.  HEART: Tachycardia, rate about 120.  ABDOMEN: Bowel sounds are present. It is soft. No guarding to deep palpation. No palpable masses. Minimal tenderness which increases with trying to splint but she cannot splint very long because of her need to hyperventilate because of her heart disease.   ASSESSMENT: It is possible she  could have some muscle soreness of the epigastric area because of her need for rapid respirations.  It is possible that she could have a stress ulcer in her abdomen from her illness.  It is possible she could even have an occult neoplasm given her very elevated CRP.   RECOMMENDATIONS:  She is clearly not a candidate for an endoscopy because of her cardiorespiratory status. She has been placed on Protonix IV and I think Mylanta 4 times a day might be helpful. I will follow with you.   ____________________________ Scot Junobert T. Antoninette Lerner, MD rte:cs D: 06/21/2012 19:26:36 ET T: 06/21/2012 19:58:27 ET JOB#: [id number]  cc: Scot Junobert T. Gifford Ballon, MD, <Dictator> Danella PentonMark F. Miller, MD Marisue IvanKanhka Linthavong, MD Scot JunOBERT T Wood Novacek MD ELECTRONICALLY SIGNED 06/23/2012 14:15

## 2014-06-02 NOTE — H&P (Signed)
PATIENT NAME:  [name] [name], [name] [name] MR#:  [id number] DATE OF BIRTH:  Feb 10, 1927  DATE OF ADMISSION:  06/20/2012  REFERRING PHYSICIAN: Dorothea Glassman, MD  PRIMARY CARE PHYSICIAN: Marisue Ivan, MD    CHIEF COMPLAINT: Upper abdominal pain and shortness of breath.   HISTORY OF PRESENT ILLNESS:  This is an 81 year old female with history of coronary artery disease, systolic congestive heart failure with severe ischemic cardiomyopathy with an ejection fraction of 30%, with also history of defibrillator and permanent pacemaker due to recurrent V. tach, hypertension, dyslipidemia, peripheral vascular disease, recently admitted on 05/22/1012 for syncopal episode that occurred twice. At that moment, it was determined that the reason of the episodes were linked to medication management as the patient was having orthostatic hypotension. At that moment, she was asked to stop her blood pressure medications and diuretics.   The patient was discharged home in good condition, and today she comes back with a history of 2 days of shortness of breath and epigastric pain. Apparently, yesterday she started hurting below the left breast. The patient felt like the pain was superficial but then started becoming deeper down into the epigastric area. The patient states that she was having significant pain around the pacemaker as well. The intensity of the pain was 7 or 8 out of 10. Today it is 5 out of 10, feels really sharp, mildly relieved with Mylanta but worse with activity. Apparently, the patient does not use oxygen at home, but for the past 2 days she has started to get increase of shortness of breath. The patient at baseline is able to rest without any significant shortness of breath, started having shortness of breath whenever she gets out of the bed and starts walking a couple of steps. From the last 48 hours, the patient has been having shortness of breath with resting. The patient says that she has been coughing but  denies any fever or chills. She has not been eating much, and actually she had a couple of pounds weight loss. No significant edema or weight gain due to fluid retention. She states that her lower extremities, right more than the left, hurt especially at the level of the calf muscle. The patient is admitted with a possible diagnosis of CHF exacerbation but also, as per ER physician, he wanted to rule out pneumonia. She got some antibiotics including azithromycin and Rocephin for the possibility of pneumonia based on the fact that the patient had bilateral exudates on the x-ray and significant elevation of the white blood count. The patient was admitted for further evaluation.    REVIEW OF SYSTEMS:  CONSTITUTIONAL: Denies any fever. Positive fatigue. Positive weakness. Denies any significant weight gain. Positive for 2-pounds weight loss.  EYES: No blurry vision, double vision, glaucoma.  ENT: No tinnitus. No ear pain.  Positive hearing loss. No postnasal drip or difficulty swallowing.  RESPIRATORY: Positive cough. Positive shortness of breath, dyspnea. No wheezing. No painful respirations.   CARDIOVASCULAR: Positive pain below the breast on the left side, so left chest pain and epigastric pain. Positive orthopnea. The patient was not able to sleep last night unless she sat down on a recliner. On the baseline, she sleeps with 2 pillows, but that was not sufficient.  Negative palpitations. Negative syncope today, but she was admitted a month ago with 2 episodes of syncope.  They have not repeated again.  GENITOURINARY: No dysuria, hematuria or changes in frequency.  ENDOCRINOLOGY: No polyuria, polydipsia or aphasia, heat or cold intolerance.  HEMATOLOGIC/LYMPHATIC:  No anemia, easy bruising or swollen glands.  SKIN: Without any rashes or petechiae.  MUSCULOSKELETAL: Positive pain of the lower extremities, mostly in the calf muscles. No gout. No swelling of joints.  NEUROLOGIC: No numbness, weakness, CVA  or transient ischemic attack.  PSYCHIATRIC: No insomnia or depression.   PAST MEDICAL HISTORY: 1.  Ischemic cardiomyopathy.  2.  Coronary artery disease, status post MI x 3.  3.  Congestive systolic heart failure due to ischemic cardiomyopathy with ejection fraction of 30%.  4.  Stable angina.  5.  IBS.  6.  Hypertension.  7.  Hyperlipidemia.  8.  History of recurrent V. tach, status post defibrillator placement.  9.  Peripheral vascular disease.  10.  History of vitreous bleeding with loss of vision on the right side.  11.  Seasonal allergies. 12.  GERD. 13.  Mild osteoarthritis.  14.  Depression.   ALLERGIES: THE PATIENT IS ALLERGIC TO LIPITOR, MEVACOR AND ANY STATIN.   PAST SURGICAL HISTORY: 1.  Positive for defibrillator and pacemaker placement.  2.  Cholecystectomy.  3.  Cardiac catheterization in 2003 with 50% occlusion of the LDA and 60% of the RCA.  4.  Left cataract surgery.   FAMILY HISTORY: Positive for CVA on her mother and colon cancer in her father. Positive hypertension throughout all her family.   SOCIAL HISTORY: The patient is a former smoker. She quit smoking 60 years ago. She denies any tobacco use.   CURRENT MEDICATIONS: Tylenol 500 mg 2 tablets every 8 hours, tramadol  50 mg 3 times a day p.r.n. pain, Systane Ultra for the eyes, Sertraline 100 mg once a day, Protonix 40 mg once daily, nitroglycerin 0.4 mg p.r.n. chest pain, MiraLax 17 grams p.o. every day, gabapentin 300 mg once a day, aspirin 81 mg daily, amiodarone 200 mg once a day, Allegra 180 mg once daily.   PHYSICAL EXAMINATION: VITAL SIGNS: Blood pressure 148/83, pulse between 198 and 120, temperature 96.8, respiratory rate in between 27 to 35. Oxygen saturation in between 88 and 90% on room air.  GENERAL: The patient is alert, oriented x 3. No significant distress at this moment although she uses accessory muscles whenever she moves or talks. At this moment, she is relaxed sitting in bed.   HEENT:  Right pupil is abnormal with scarring tissue due to vitreous hemorrhage  and loss of vision.  Left pupil is round and responsive. Extraocular movements are intact. Anicteric sclerae. Pink conjunctivae. No oral lesions. No oropharyngeal exudates. No rigidity. No masses.  CARDIOVASCULAR: Regular rate and rhythm. No murmurs, rubs, or gallops. The patient has increased heart rate in the 100s. No displacement of PMI.  LUNGS: Positive bilateral crackles up to middle line likely related to CHF. Positive use of accessory muscles when she moves her toes. No dullness to percussion.  ABDOMEN: Soft, nontender, nondistended. No hepatosplenomegaly. No masses. Bowel sounds are positive. There is actually some mild tenderness with deeper palpation at the level of the epigastric area.  SKIN: Without any rashes or petechiae. Decreased turgor.  MUSCULOSKELETAL: No significant joint effusions or exudates.  NEUROLOGIC: Cranial nerves II through XII intact. Strength is 5 out of 5 in 4 extremities. No unilateral symptoms.  PSYCHIATRIC:  Mood:  The patient looks stable, alert and oriented x 3. No significant agitation.  LYMPHATICS: Negative for lymphadenopathy in neck or supraclavicular areas.   LABORATORY AND RADIOLOGICAL DATA:  Potassium is 2.9, glucose is 146, BUN is 31, BNP is 26,000, sodium is 132, total protein  7.2, albumin 2.7. Troponin 0.08, TSH 219.  White blood cells 17,000, hemoglobin 12, platelets 389.   Chest x-ray: Bilateral lung infiltrates likely due to CHF.  EKG: Sinus tachycardia. Wide QRS with left bundle branch block and right bundle branch block.   ASSESSMENT AND PLAN: An 81 year old female admitted with a history of shortness of breath. She has congestive heart failure, ischemic cardiomyopathy, hypertension, hyperlipidemia and peripheral vascular disease.   1.  Acute respiratory failure: The patient is requiring 3 to 4 liters of nasal cannula, and this is likely due to CHF exacerbation.  At this  moment, we are going to put her on nebs and try to diurese her.  2.  Congestive heart failure: The patient has elevation of BNP of 26,000.  Chest x-ray compatible with bilateral infiltrates, likely pulmonary edema. No significant signs of infection. The patient is afebrile. No significant cough with production of bacteria.  The patient was given antibiotics today for possible pneumonia, but I think this is pure CHF for which I am going to hold on the antibiotics right now.  Since she has abdominal pain, there is an order for a CT scan of the abdomen, and since she is going to get the radiation I am going to ask them to add on the chest CT as well.  3.  Abdominal pain and left chest pain:  This could be related to acute coronary syndrome. The already has angina. Her troponin is slightly elevated to 0.08.  At this moment, I am going to just treat it with heparin prophylactic dose and cycle enzymes.  If the enzymes continue to rise, we are going to change to full anticoagulation with either heparin drip over Lovenox.  4.  Hyperglycemia:  Likely due to stress, continue to monitor.  5.  Hyponatremia:  Likely due to fluid overload. Continue Lasix.  6.  Ischemic cardiomyopathy with systolic congestive heart failure:  The patient is not able to tolerate beta blockers or ACE inhibitors due to severe hypotension orthostatically and syncope for which we are not going to provide these medications for now. As the patient has new infiltrates both lungs and congestive heart failure exacerbation, I am going to just restart her on a low dose of Lasix. She might need to be discharged on Lasix lower dose or every other day.  7.  Systemic inflammatory response syndrome:  Elevation of white count of 17,000, heart rate above 100, respiratory rate about 35.  This could or could not be related to lung infection.  Pneumonia still in the differential, although the patient has not had any phlegm. No fever, and her cough is not  significant, for which I am going to refrain to give her antibiotics and monitor her vitals and progression. At this moment, the patient received 1 dose of Rocephin and azithromycin, and this is medication scheduled every 24 hours.  So, if tomorrow the patient looks more like having more pneumonia or having fevers, we are going to treat it with antibiotics then, but at this moment we are holding antibiotics.  8.  History of ventricular tachycardia: The patient has a pacemaker.  9.  Gastroesophageal reflux disease:  Protonix.  10.  Depression:  Gabapentin or sertraline.    CODE STATUS:  The patient is a DNR.   TIME SPENT: I spent about 50 minutes with this admission.   ____________________________ Felipa Furnaceoberto Sanchez Gutierrez, MD rsg:cb D: 06/20/2012 16:06:06 ET T: 06/20/2012 18:13:20 ET JOB#: [id number]  cc: Felipa Furnaceoberto Sanchez Gutierrez, MD, <Dictator>  Pearletha Furl MD ELECTRONICALLY SIGNED 06/21/2012 12:14

## 2014-06-02 NOTE — H&P (Signed)
PATIENT NAME:  [name] [name], [name] [name] MR#:  [id number] DATE OF BIRTH:  Sep 25, 1926  REFERRING PHYSICIAN: Emergency room doctor.   PRIMARY CARE PHYSICIAN:  Marisue Ivan, MD.   REASON FOR ADMISSION: Congestive heart failure exacerbation.   HISTORY OF PRESENT ILLNESS: This is a pleasant elderly female with multiple medical problems including severe CHF with an EF of 25%, anxiety, depression, mild dementia, as well as recent weight loss and a heel ulcer.   She has had admissions recently over the last six months including one for pneumonia in May 2014 and an admission in April for syncope.   She has continued to decline at home and continues to lose weight. Her daughter is with her today. She was doing relatively well until recently when she started to develop increasing lower extremity edema, fevers and shortness of breath.   When the patient was brought to the hospital, she was noted to be hypoxic and to have an elevated BNP and chest x-ray showing evidence of volume overload. We are called for admission. In the ED, she did receive some IV Lasix and is placed on O2.   PAST MEDICAL HISTORY:  1.  CHF, EF of 25% in 2013. She follows with Dr. Gwen Pounds for this.  2.  Coronary artery disease.  3.  Afib, on amiodarone.  4.  Placement of a defibrillator.  5.  Hypertension.  6.  Dyslipidemia but INTOLERANT OF STATINS.  7.  Peripheral vascular disease.  8.  Irritable bowel syndrome.  9.  Allergies.  10.  GERD.  11.  Depression.  12.  Mild osteoarthritis.  13.  Mild dementia.   PAST SURGICAL HISTORY:  1.  Placement of defibrillator.  2.  Cholecystectomy.  3.  Cataract surgery.   SOCIAL HISTORY: The patient is currently living with her daughter, who is currently caring for her. She has been getting physical therapy. She does not smoke or drink.   FAMILY HISTORY: Positive for CVA in her mother and colon cancer in her father.   REVIEW OF SYSTEMS: Eleven systems were reviewed and negative except  as per HPI.    ALLERGIES: STATINS AND PRILOSEC.   CURRENT MEDICATIONS:  Reviewed from her outpatient chart include: 1.  Aspirin 81 once a day.  2.  Pantoprazole 40 mg once a day.  3.  Gabapentin 300 once in the evening.  4.  Tylenol p.r.n.  5.  Zoloft 100 mg once a day.  6.  Tramadol 50 mg three times a day as needed.  7.  Xanax 0.25 at bedtime.  8.  Potassium chloride 20 mEq twice a day.  9.  Lasix 40 mg twice a day.  10.  Amiodarone 100 mg once a day.  11.  Probiotics.  12.  MiraLax.  13.  Bentyl.   PHYSICAL EXAMINATION:  VITAL SIGNS: Temperature was 97.7. Pulse 115, blood pressure 119/84, saturation 85% on room air. Respiratory rate was 26.  GENERAL: Very frail and cachectic, lying in bed. She is alert and oriented.  HEENT: Pupils equal, round and reactive to light and accommodation. Extraocular movements are intact.  OROPHARYNX: Clear.  NECK: Supple. She does have positive JVD at the angle of her jaw.  HEART: Tachy with a 2/6 systolic murmur as well as a gallop.  LUNGS: Diffuse crackles bilaterally.  ABDOMEN: Soft, nontender, nondistended. No hepatosplenomegaly.  EXTREMITIES: Show 2+ pitting edema in her lower extremities.  SKIN: Heel ulcer on the right which is covered with dressing.  NEUROLOGIC: Alert, knows she is at  [facility]. She knows the year. Able to move all four extremities.   DIAGNOSTIC DATA: Chest x-ray by my read shows cardiomegaly as well as impressive CHF. ABG 7.49/29/50.  Lactic acid was elevated at 1.8. CK total was 59. MB 10.8. BNP 22,630. Troponin is 0.03, BUN 26, creatinine 1.2, potassium 4.1. Albumin is low at 2.7. EKG shows wide complex tachycardia, heart rate 113. There is a right bundle branch block.   IMPRESSION: A pleasant elderly female with progressive decline over the last several months including weight loss, several admissions for pneumonia, congestive heart failure. She has a severe congestive heart failure with an ejection fraction of  25%. She is admitted with hypoxia, chest x-ray findings of congestive heart failure, elevated BNP, and tachycardia.   PLAN:  1.  Will admit the patient to telemetry.  2.  Cycle cardiac enzymes.  3.  Continue Lasix 40 mg IV twice a day.  4.  Follow closely her ins and outs as well as her BUN and creatinine.  5.  For her atrial fibrillation, we will continue her on her amiodarone.  6.  For depression, we will continue her on her Zoloft.  7.  Given her progressive decline, weight loss, heel ulcers, will consult palliative care. They have seen her in the past. The patient is currently DNR and I discussed this with the patient and her daughter. There is a question as to whether she should move more towards hospice care.  8.  For her heel ulcer, I have consulted wound care.  9.  For deep vein thrombosis prophylaxis, we will place the patient on subcutaneous heparin.  10.  Gastroenterology will continue her on her pantoprazole.  11.  She is malnourished. We will place her on Ensure.    ____________________________ Stann Mainlandavid P. Sampson GoonFitzgerald, MD dpf:np D: 09/23/2012 19:53:00 ET T: 09/23/2012 20:41:20 ET JOB#: [id number]  cc: Stann Mainlandavid P. Sampson GoonFitzgerald, MD, <Dictator> DAVID Sampson GoonFITZGERALD MD ELECTRONICALLY SIGNED 09/26/2012 21:30

## 2014-06-02 NOTE — Consult Note (Signed)
Pt CC resp failure.  No signif tenderness, very slight discomfort in LLQ.  soft, non distended.  Continue IV protonix and if ok in 2 days switch to oral.  Pt daughter said the patient drank some Ensure today.  Pt very SOB with any effort to talk much.  Electronic Signatures: Scot JunElliott, Dai Apel T (MD)  (Signed on 14-May-14 15:01)  Authored  Last Updated: 14-May-14 15:01 by Scot JunElliott, Orlan Aversa T (MD)

## 2014-06-02 NOTE — Consult Note (Signed)
   Comments   Met with pt's daughter. She confirms patient has been declining over past several months. She is now primariliy confined to a wheelchair and becomes short of breath with the few steps needed to walk to the bathroom. Family want to keep her home as patient was not happy in a facility. Daughter is agreeable to the idea of hospice involvement at home and she seems to understand that pt is at high risk for continued decline. Daughter seems interested in any strategy to help avoid hospitalization. Will order hospice screening.    Electronic Signatures: Borders, Kirt Boys (NP)  (Signed 15-Aug-14 11:14)  Authored: Palliative Care Phifer, Izora Gala (MD)  (Signed 15-Aug-14 14:19)  Authored: Palliative Care   Last Updated: 15-Aug-14 14:19 by Phifer, Izora Gala (MD)

## 2014-06-02 NOTE — Discharge Summary (Signed)
PATIENT NAME:  [name], [name] MR#:  [id number] DATE OF BIRTH:  07/22/1926  DATE OF ADMISSION:  05/22/2012 DATE OF DISCHARGE:  05/25/2012  DISCHARGE DIAGNOSES: 1.  Syncope secondary to hypotension.  2.  History of coronary artery disease status post myocardial infarction in 5537.  3.  Systolic congestive heart failure with ejection fraction of 25% in 01/2012 with automatic implantable cardiac defibrillator in place.  4.  History of angina, asymptomatic presently.  5.  Irritable bowel syndrome.   6.  Spinal stenosis. 7.  Peripheral vascular disease. 8.  Degenerative disk disease.  9.  History of hypertension, holding blood pressure medicines.  10.  Anxiety and depression.  11.  Blindness in the right eye.   DISCHARGE MEDICATIONS: 1.  MiraLax 17 grams p.o. daily as needed for constipation. 2.  Allegra 180 mg p.o. daily.  3.  Amiodarone 200 mg p.o. daily.  4.  Aspirin 81 mg p.o. daily. 5.  Nitroglycerin 0.4 mg 1 tablet sublingual q. 5 minutes x 3 as needed for chest pain. 6.  Pantoprazole 40 mg 1 tab p.o. daily.   7.  Sertraline 100 mg p.o. daily. 8.  Systane ultra ophthalmic solution 2 drops in each 1 to 2 times a day.  9.  Gabapentin 300 mg 1 capsule p.o. at bedtime.  10.  Tramadol 50 mg p.o. t.i.d. p.r.n. for pain.  11.  Tylenol 500 mg 2 tabs every 8 hours as needed for pain.   CONSULTANTS: Cardiology.   PROCEDURES: None.   PERTINENT LABORATORY AND DIAGNOSTICS:  Sodium 135, potassium 3.8, creatinine 1.03 and glucose 99. AST 95, ALT 118, total bili 0.4 and alk phos 81. White blood cell count 3.3, hemoglobin 13.7 and platelets 149.  CT of the head negative.   Chest x-ray negative.   Urine culture negative.   Echo was read by Dr. Clayborn Bigness, but no result available.  CK-MB 12.8, 12.6 and 17.4. Troponin 0.31, 0.25 and 0.19.   BRIEF HOSPITAL COURSE:  1.  Syncope. The patient had 2 syncopal episodes prior to admission. During her admission she was placed on telemetry and  evaluated.  It was thought that her syncope was due to hypotension and her blood pressure medications were held. She was evaluated by cardiology.  No cardiac intervention was performed.  Her symptoms did resolve. She was able to ambulate without any further symptoms. It was thought that we would continue to hold her blood pressure medications at this time.  Echocardiogram is still pending.  It did not appear that she had a cardiac event.  2.  Her other chronic medical issues remain stable at this time. No changes to the home regimen.   DISPOSITION: She is in stable condition and will be discharged to home. We did speak with her daughter who was comfortable with her coming home.   DISCHARGE FOLLOWUP:  With Dr. Netty Starring within 10 days.  ____________________________ Dion Body, MD kl:sb D: 05/25/2012 08:10:05 ET T: 05/25/2012 08:18:15 ET JOB#: [id number]  cc: Dion Body, MD, <Dictator> Dion Body MD ELECTRONICALLY SIGNED 06/07/2012 8:23

## 2015-02-15 IMAGING — CR DG CHEST 1V PORT
1 series · 1 of 1 positions shown · non-contrast
Comparison: none

REASON FOR EXAM: syncope
COMMENTS:

PROCEDURE:     DXR - DXR PORTABLE CHEST SINGLE VIEW  - May 22, 2012 [DATE]
RESULT:     Comparison: 10/20/2010

[ap]
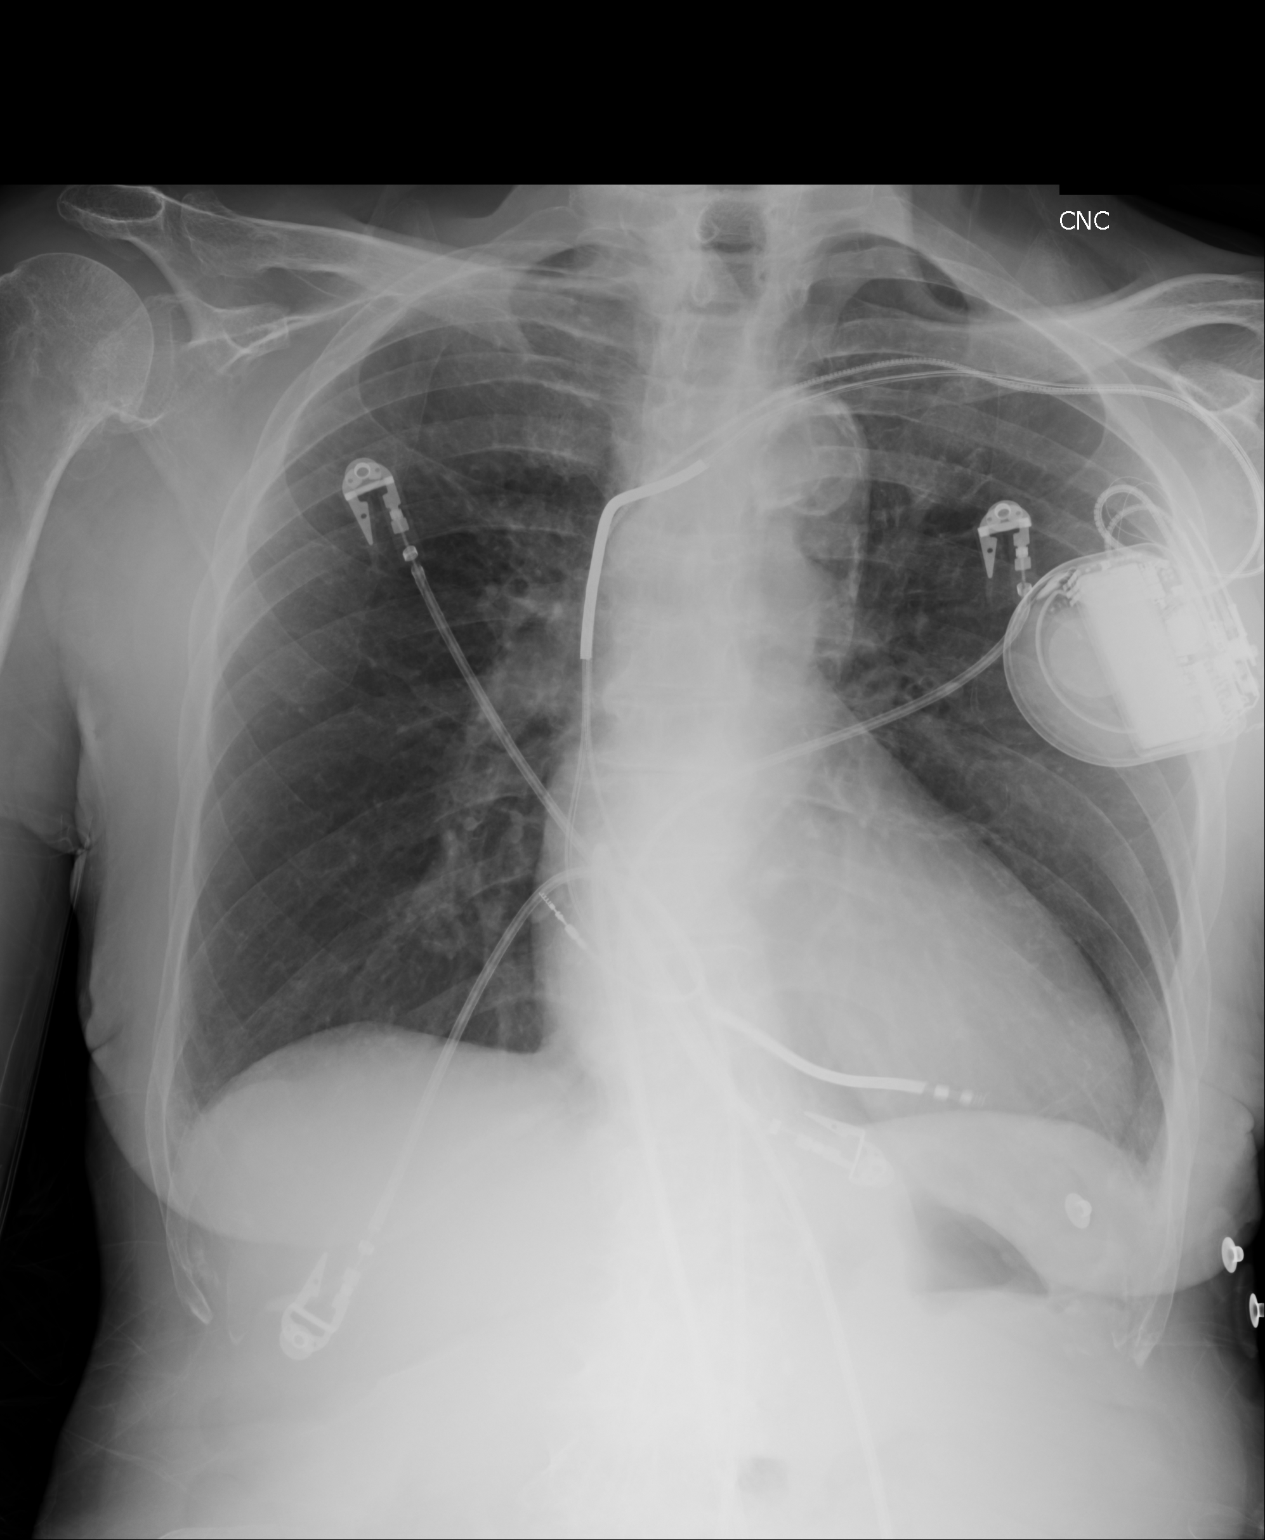

[1 of 1 positions shown; findings below may reference images not displayed]

FINDINGS: The heart and mediastinum are stable. Wires are seen from multilead AICD.
Small nodular density overlying the medial right upper lung is likely
secondary to the overlying first costochondral junction. Minimal reticular
opacities in the right costophrenic angle may be secondary to atelectasis
scarring.
IMPRESSION: No acute cardiopulmonary disease.

[REDACTED]

## 2015-03-16 IMAGING — US US EXTREM LOW VENOUS BILAT
1 series · 13 of 24 positions shown · non-contrast
Comparison: none

REASON FOR EXAM: EDEMA AND PAIN ON CALF
COMMENTS:

PROCEDURE:     US  - US DOPPLER LOW EXTR BILATERAL  - June 20, 2012  [DATE]
RESULT:     Comparison: None

[Series 1: us extrem low venous bilat · 0.08mm/px · 13 of 72 slices shown]
[im 1/72]
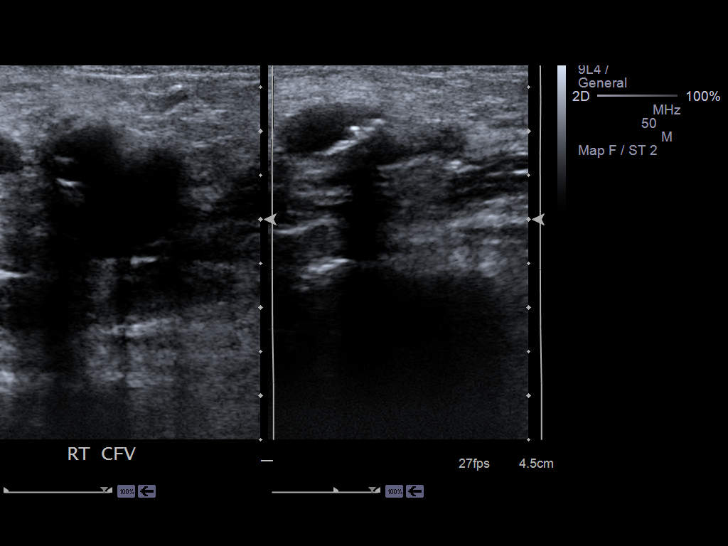
[im 7/72]
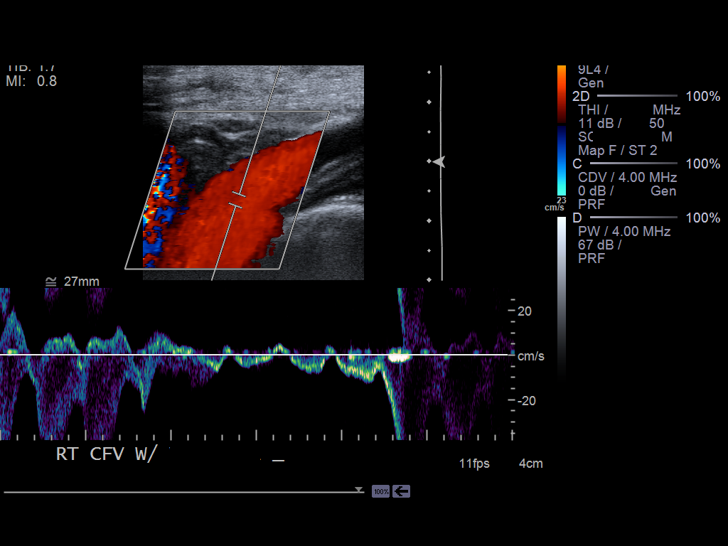
[im 13/72]
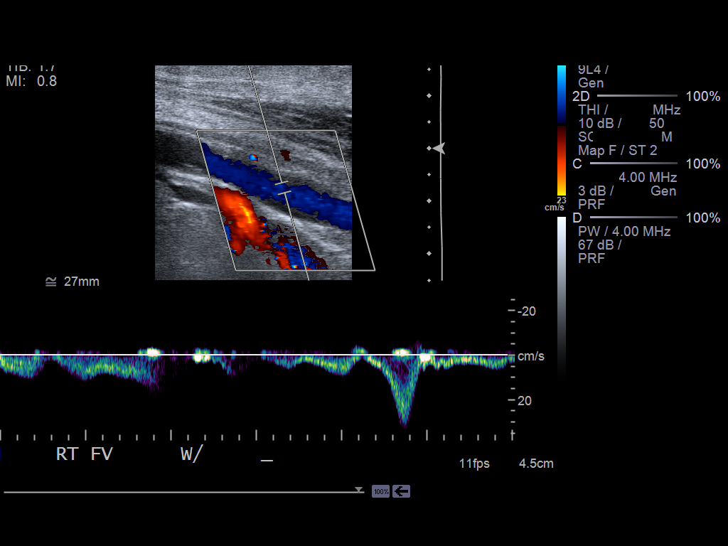
[im 19/72]
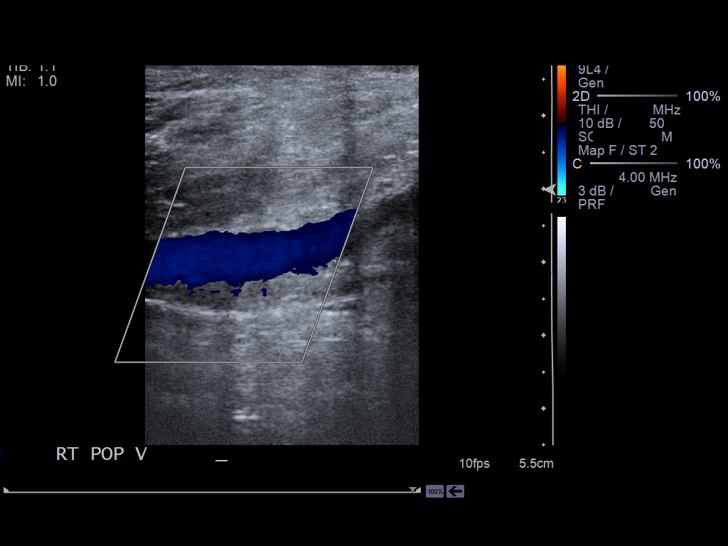
[im 25/72]
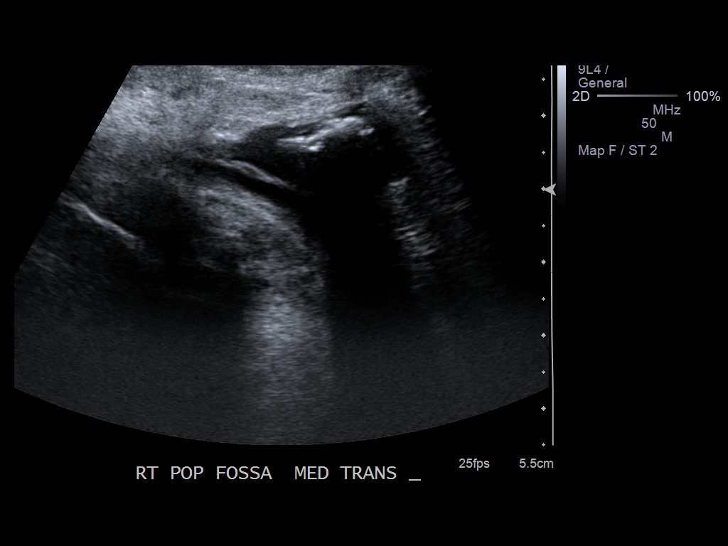
[im 31/72]
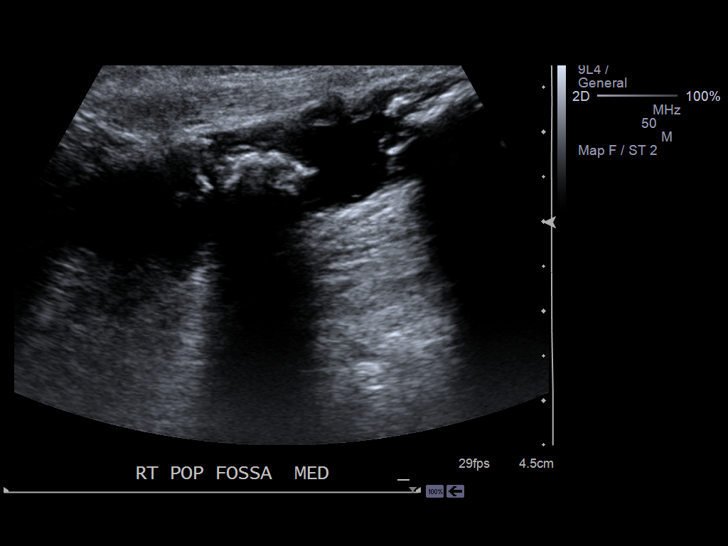
[im 38/72]
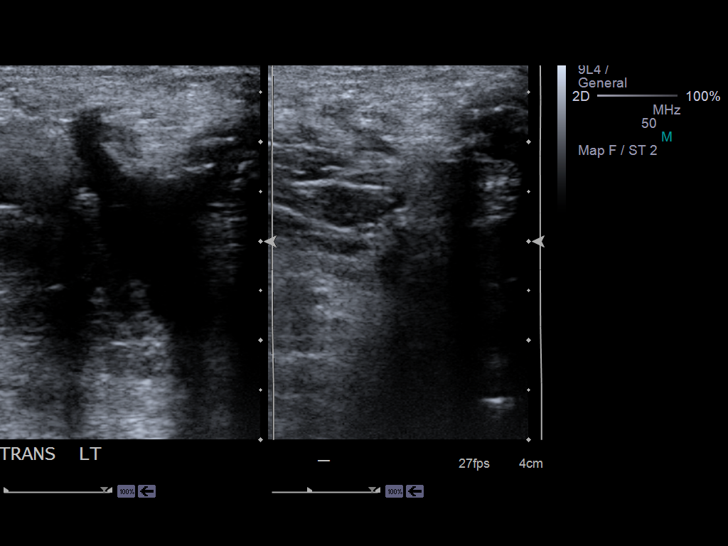
[im 41/72]
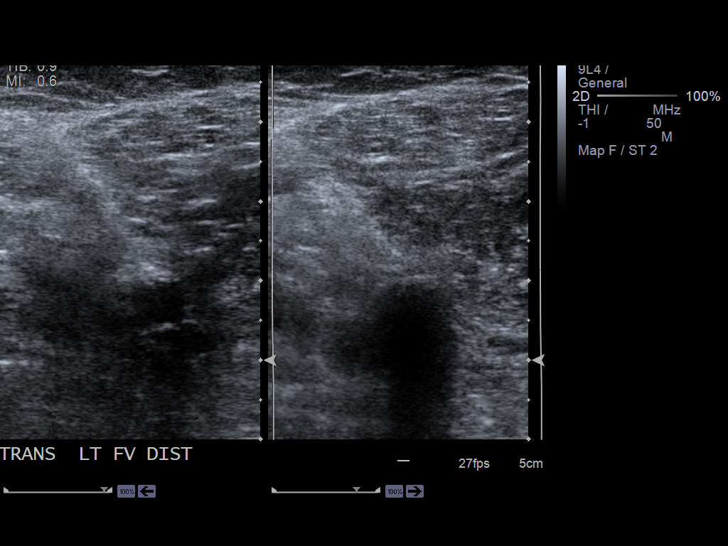
[im 47/72]
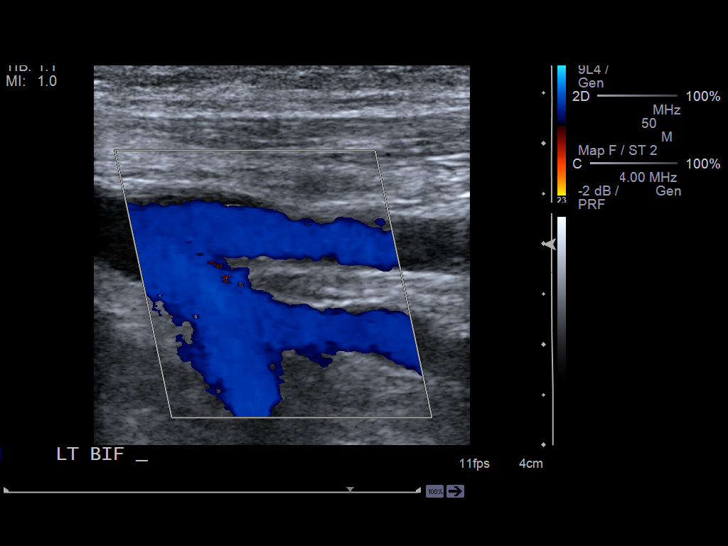
[im 53/72]
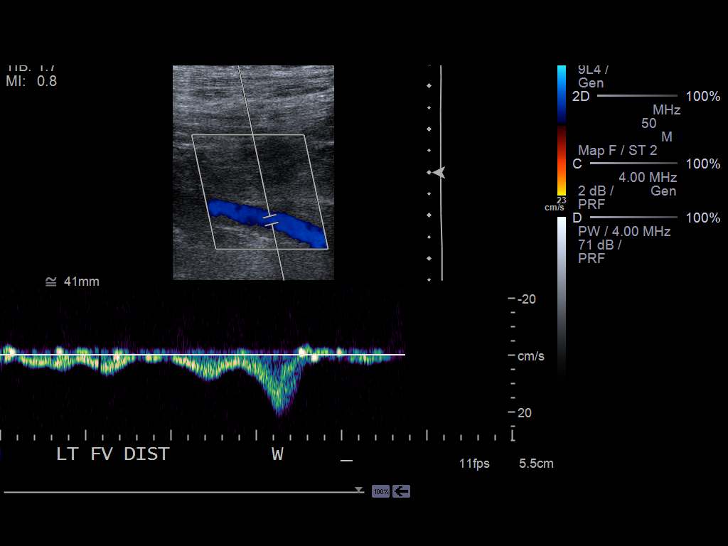
[im 59/72]
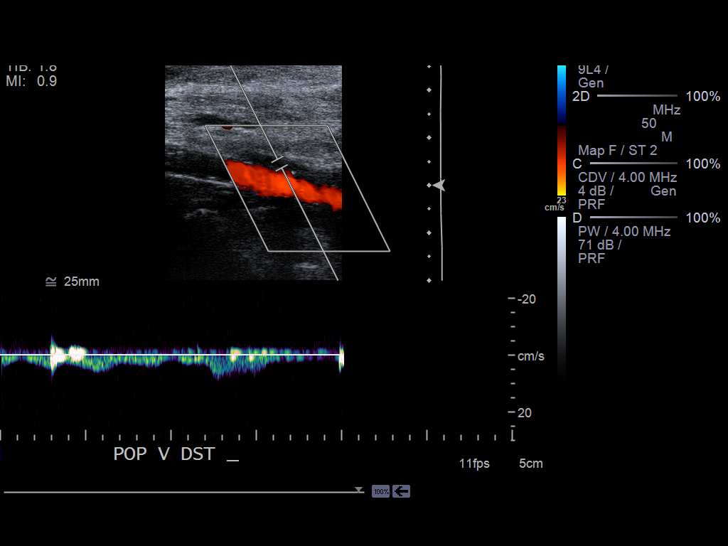
[im 65/72]
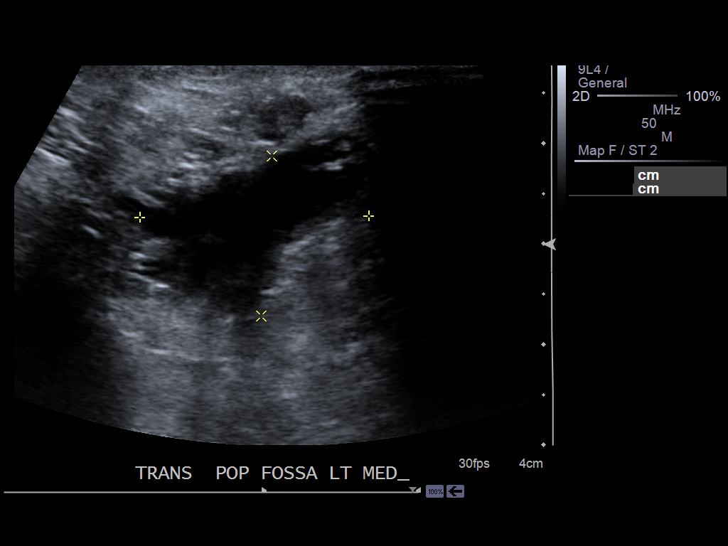
[im 72/72]
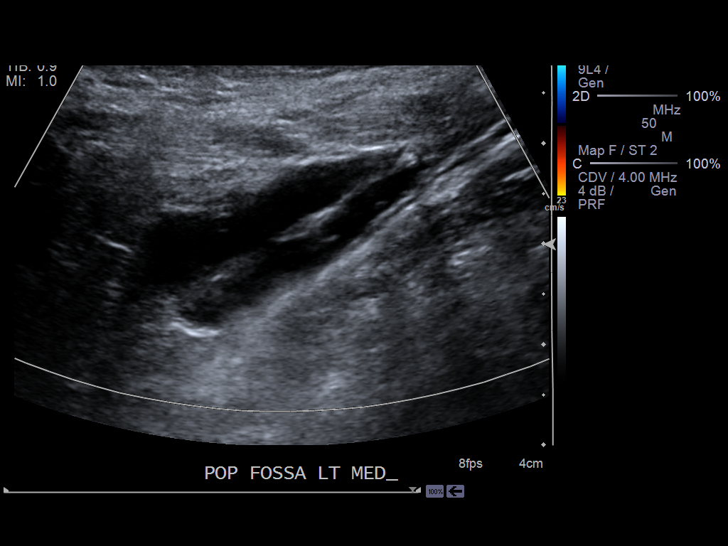

[13 of 24 positions shown; findings below may reference images not displayed]

FINDINGS: Multiple longitudinal and transverse gray-scale as well as color
and spectral Doppler images of  bilateral lower extremity veins were
obtained from the common femoral veins through the popliteal veins.

The right common femoral, greater saphenous, femoral, popliteal veins, and
venous trifurcation are patent, demonstrating normal color-flow and
compressibility. No intraluminal thrombus is identified.There is normal
respiratory variation and augmentation demonstrated at all vein levels.

The left common femoral, greater saphenous, femoral, popliteal veins, and
venous trifurcation are patent, demonstrating normal color-flow and
compressibility. No intraluminal thrombus is identified.There is normal
respiratory variation and augmentation demonstrated at all vein levels.

There bilateral popliteal fossa hypoechoic masses with no internal Doppler
flow along the medial aspect likely representing Baker's cysts.
IMPRESSION: 1. No evidence of DVT in the right lower extremity.
2. No evidence of DVT in the left lower extremity.
3. Bilateral Baker's cysts.

[REDACTED]

## 2015-03-16 IMAGING — CR DG CHEST 1V PORT
1 series · 1 of 1 positions shown · non-contrast
Comparison: none

REASON FOR EXAM: cp
COMMENTS:

PROCEDURE:     DXR - DXR PORTABLE CHEST SINGLE VIEW  - June 20, 2012  [DATE]
RESULT:

[ap]
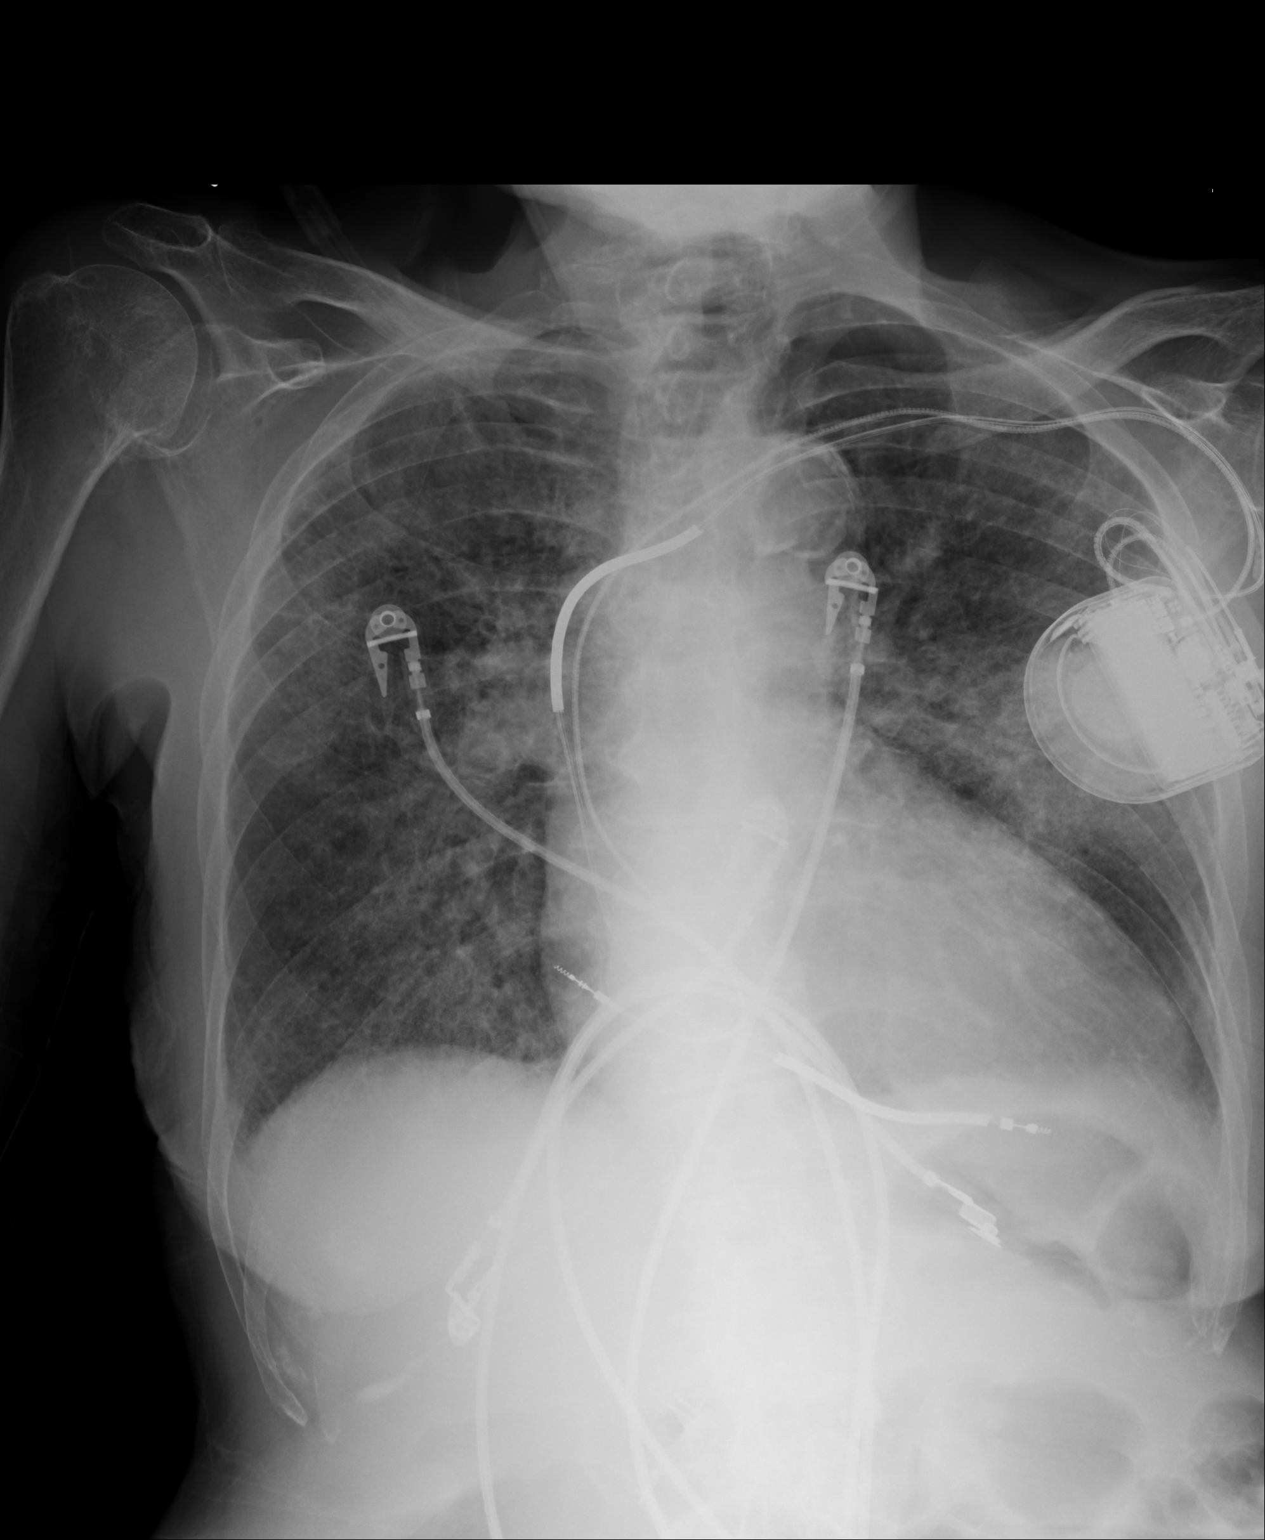

[1 of 1 positions shown; findings below may reference images not displayed]

FINDINGS: There is prominence of the interstitial markings and
indistinctness of the pulmonary vasculature. There is a component of
peribronchial cuffing. Bilateral perihilar opacities are identified. The
cardiac silhouette is enlarged. The visualized bony skeleton is unremarkable.
IMPRESSION: 1. Interstitial infiltrate likely representing a component of pulmonary
edema. Focal infiltrates versus areas of asymmetric edema versus regions of
edema in the perihilar regions as described above. Surveillance evaluation
is recommended status post appropriate therapeutic regimen.

## 2015-03-17 IMAGING — CR DG CHEST 1V PORT
1 series · 1 of 1 positions shown · non-contrast
Comparison: none

REASON FOR EXAM: eval for CHF
COMMENTS:

[ap]
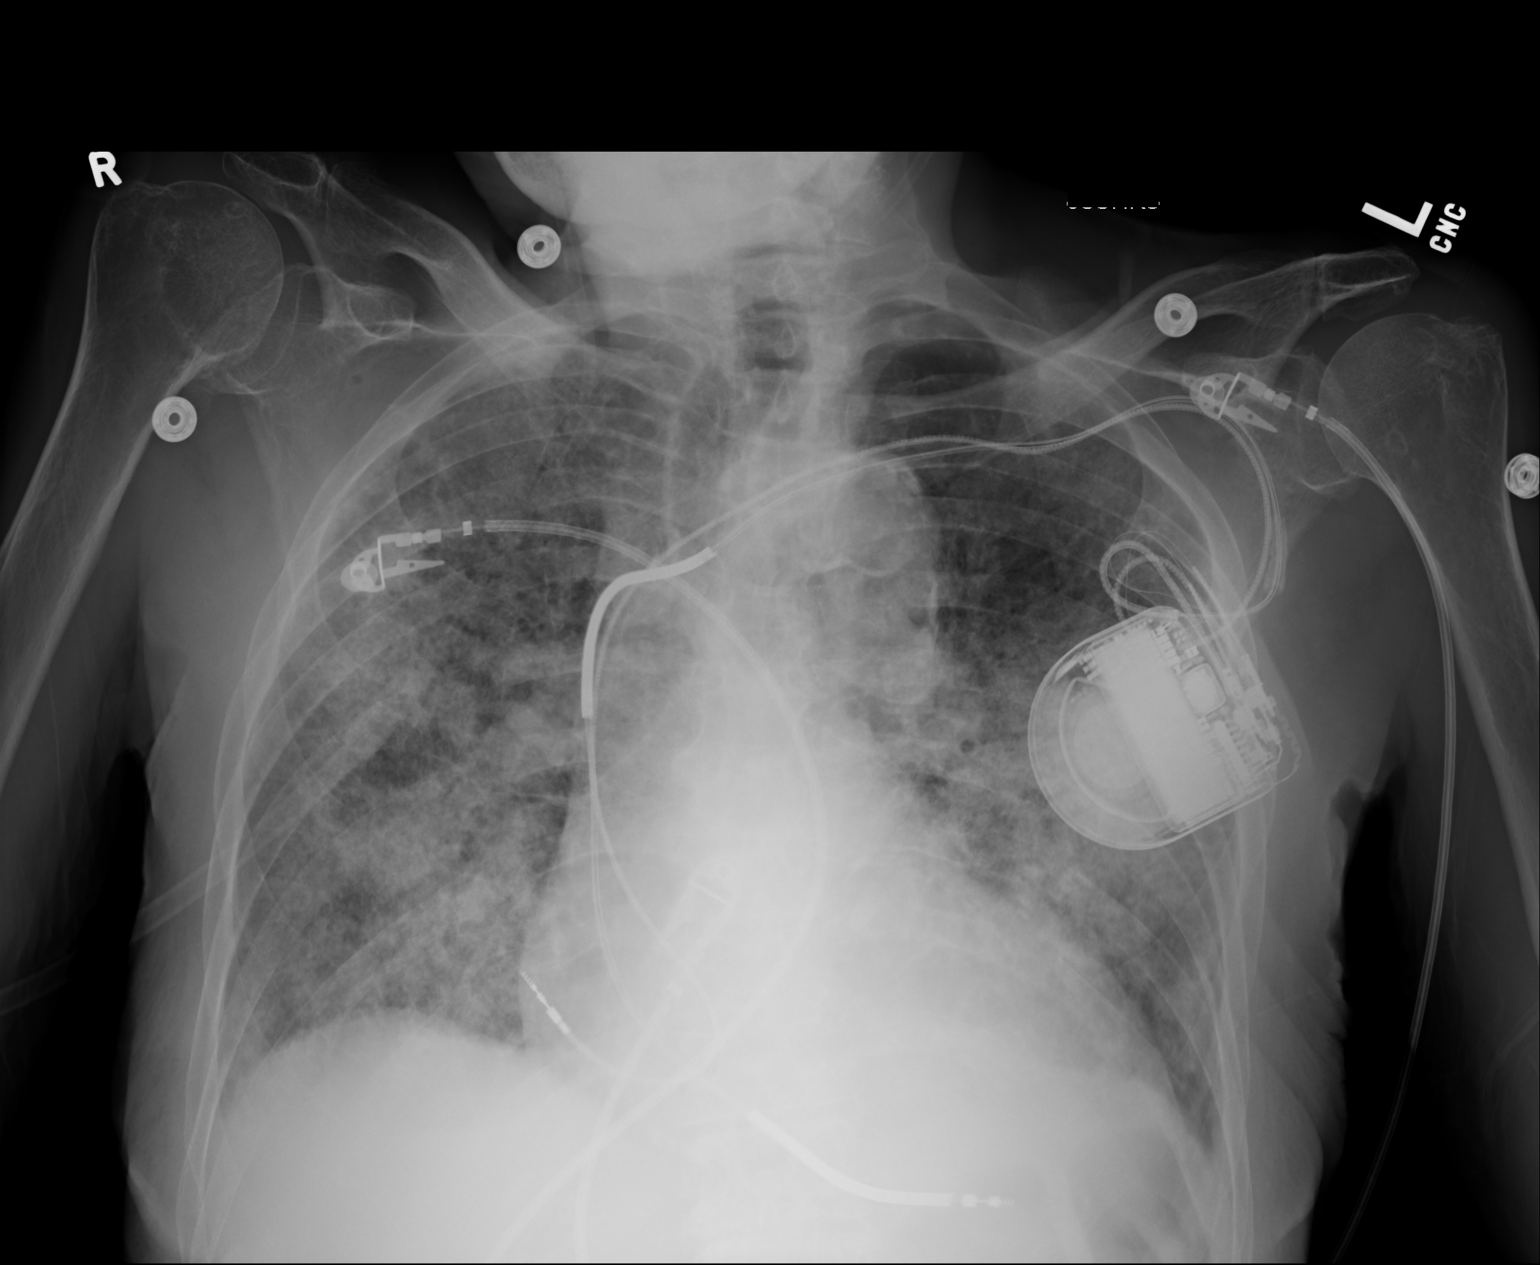

[1 of 1 positions shown; findings below may reference images not displayed]

PROCEDURE:     DXR - DXR PORTABLE CHEST SINGLE VIEW  - June 21, 2012  [DATE]

RESULT:     Comparison is made to the study June 20, 2012.

The lungs are well-expanded. There are fluffy alveolar infiltrates
bilaterally which are more conspicuous than on yesterday's study. The
cardiac silhouette is mildly enlarged. There is a cardiac
pacemaker-defibrillator in place.
IMPRESSION: The findings are consistent with pulmonary alveolar edema
secondary to CHF. There is no significant pleural fluid collection.

[REDACTED]
